# Patient Record
Sex: Male | Born: 1935 | Race: White | Hispanic: No | Marital: Married | Smoking: Former smoker
Health system: Southern US, Community
[De-identification: ages and names within clinical notes are randomized; demographics above are authoritative.]

## PROBLEM LIST (undated history)

## (undated) DIAGNOSIS — C61 Malignant neoplasm of prostate: Secondary | ICD-10-CM

## (undated) DIAGNOSIS — E119 Type 2 diabetes mellitus without complications: Secondary | ICD-10-CM

## (undated) DIAGNOSIS — H3530 Unspecified macular degeneration: Secondary | ICD-10-CM

---

## 2001-02-13 ENCOUNTER — Other Ambulatory Visit: Admission: RE | Admit: 2001-02-13 | Discharge: 2001-02-13 | Payer: Self-pay | Admitting: Urology

## 2001-02-13 ENCOUNTER — Encounter (INDEPENDENT_AMBULATORY_CARE_PROVIDER_SITE_OTHER): Payer: Self-pay | Admitting: Specialist

## 2001-03-07 ENCOUNTER — Ambulatory Visit: Admission: RE | Admit: 2001-03-07 | Discharge: 2001-06-05 | Payer: Self-pay | Admitting: Radiation Oncology

## 2004-07-22 ENCOUNTER — Encounter: Admission: RE | Admit: 2004-07-22 | Discharge: 2004-07-22 | Payer: Self-pay | Admitting: *Deleted

## 2007-01-11 ENCOUNTER — Encounter (HOSPITAL_COMMUNITY): Admission: RE | Admit: 2007-01-11 | Discharge: 2007-02-07 | Payer: Self-pay | Admitting: Urology

## 2007-03-05 ENCOUNTER — Encounter: Admission: RE | Admit: 2007-03-05 | Discharge: 2007-03-05 | Payer: Self-pay | Admitting: Urology

## 2007-03-07 ENCOUNTER — Ambulatory Visit (HOSPITAL_BASED_OUTPATIENT_CLINIC_OR_DEPARTMENT_OTHER): Admission: RE | Admit: 2007-03-07 | Discharge: 2007-03-07 | Payer: Self-pay | Admitting: Urology

## 2008-10-27 ENCOUNTER — Ambulatory Visit (HOSPITAL_COMMUNITY): Admission: RE | Admit: 2008-10-27 | Discharge: 2008-10-27 | Payer: Self-pay | Admitting: Urology

## 2009-11-09 ENCOUNTER — Encounter (HOSPITAL_COMMUNITY): Admission: RE | Admit: 2009-11-09 | Discharge: 2010-02-07 | Payer: Self-pay | Admitting: Urology

## 2010-06-06 ENCOUNTER — Encounter: Payer: Self-pay | Admitting: Urology

## 2010-09-28 NOTE — Op Note (Signed)
Albert Rogers, Albert Rogers                   ACCOUNT NO.:  [id number]   MEDICAL RECORD NO.:  [id number]          PATIENT TYPE:  AMB   LOCATION:  [facility]                         FACILITY:  [facility]   PHYSICIAN:  Ronald L. Earlene Plater, M.D.  DATE OF BIRTH:  05/04/1933   DATE OF PROCEDURE:  03/07/2007  DATE OF DISCHARGE:                               OPERATIVE REPORT   DIAGNOSIS:  Recurrent adenocarcinoma of prostate.   OPERATIVE PROCEDURE:  Cryosurgical ablation of prostate.   SURGEON:  Lucrezia Starch. Earlene Plater, M.D.   ANESTHESIA:  General endotracheal.   BLOOD LOSS:  10 mL.   TUBE:  Suprapubic tube which was placed in a punch fashion.   COMPLICATIONS:  None.   INDICATIONS FOR PROCEDURE:  Mr. Wedge is a very nice 77 year old white  male who is status post TURP in the distant past.  He subsequently  underwent x-ray therapy for a Gleason score 7 which was 4 + 3  adenocarcinoma.  He had a PSA nadir in 2006 of 0.98, subsequently  increased to 3.04 and Dr. Laverle Patter performed a biopsy in September 2008,  which revealed a Gleason score 6 which was 3 + 3 adenocarcinoma, 5% on  the right.  Metastatic workup was negative.  After understanding risks,  benefits and alternatives particularly having had TURP and radiation  with incontinence, urethral slough and other potential problems  including rectal injuries, elected to proceed with cryosurgical ablation  of prostate.   DESCRIPTION OF PROCEDURE:  The patient was placed in the supine  position.  After proper general endotracheal anesthesia, was placed in  the dorsal lithotomy position, prepped and draped with Betadine in  sterile fashion.  Cystourethroscopy was performed with a flexible  cystourethroscope and the bladder was noted to be smooth-walled.  Efflux  of clear urine was noted from the normally placed ureteral orifices  bilaterally.  There was significant apical regrowth, but there was a TUR  defect especially on the left side towards the bladder neck.  A punch  suprapubic tube was placed under direct vision and secured in place with  the Cope mechanism and sutured with silk to secure it on the skin.  The  cystoscope was removed.  A 16-French Foley catheter was inserted along  with the B&K biplanar transrectal ultrasound probe.  The prostate tissue  was localized and probes were placed in the appropriate fashion  utilizing ice rods.  Thermistors were placed in the external sphincter  and Denonvilliers' fascia.  Foley catheter was removed.  Flexible  cystourethroscopy was performed.  There were no perforations in the  urethra and the urethral warming device was placed utilizing an Amplatz  super stiff wire under direct vision and then utilizing the ultrasound  probe.  We were comfortable with locations of the probes and freeze-thaw  cycle was performed in the usual manner.  This is documented in the  record. We were comfortable with the thaw, but in the 6 o'clock  position.  There was a tiny area of tissue at the apical area that did  not completely blacken out.  We were  very comfortable with temperatures  on the thermistor probes.  After thaw, an ice seed was placed in this as  is documented and the second freeze-thaw cycle was performed and we felt  there was an excellent freeze-thaw with good thermistor temperatures and  totally blackened out prostate.  An active thaw was then performed.  The  urethral  warming was maintained for 30 minutes.  All probes were removed.  Pressure was held on the puncture site for a few minutes and dressing  was placed.  The urethral warmer was removed.  The suprapubic tube was  placed to drainage and the patient was taken to the recovery room  stable.      Ronald L. Earlene Plater, M.D.  Electronically Signed     RLD/MEDQ  D:  03/07/2007  T:  03/08/2007  Job:  [id number]

## 2010-12-20 ENCOUNTER — Other Ambulatory Visit (HOSPITAL_COMMUNITY): Payer: Self-pay | Admitting: Urology

## 2010-12-20 DIAGNOSIS — C61 Malignant neoplasm of prostate: Secondary | ICD-10-CM

## 2010-12-29 ENCOUNTER — Encounter (HOSPITAL_COMMUNITY)
Admission: RE | Admit: 2010-12-29 | Discharge: 2010-12-29 | Disposition: A | Discharge status: Home / Self Care | Payer: Medicare Other | Source: Ambulatory Visit | Attending: Urology | Admitting: Urology

## 2010-12-29 ENCOUNTER — Ambulatory Visit (HOSPITAL_COMMUNITY)
Admission: RE | Admit: 2010-12-29 | Discharge: 2010-12-29 | Disposition: A | Discharge status: Home / Self Care | Payer: Medicare Other | Source: Ambulatory Visit | Attending: Urology | Admitting: Urology

## 2010-12-29 DIAGNOSIS — C61 Malignant neoplasm of prostate: Secondary | ICD-10-CM | POA: Insufficient documentation

## 2010-12-29 MED ORDER — TECHNETIUM TC 99M MEDRONATE IV KIT
25.0000 | PACK | Freq: Once | INTRAVENOUS | Status: AC | PRN
  Administered 2010-12-29: 25 via INTRAVENOUS

## 2011-02-23 LAB — URINALYSIS, ROUTINE W REFLEX MICROSCOPIC
Glucose, UA: NEGATIVE
Hgb urine dipstick: NEGATIVE
Nitrite: NEGATIVE
Protein, ur: NEGATIVE
Specific Gravity, Urine: 1.035 — ABNORMAL HIGH
Urobilinogen, UA: 0.2
pH: 5

## 2011-02-23 LAB — PROTIME-INR
INR: 1
Prothrombin Time: 13.5

## 2011-02-23 LAB — APTT: aPTT: 29

## 2011-02-23 LAB — COMPREHENSIVE METABOLIC PANEL
ALT: 34
AST: 31
Albumin: 3.2 — ABNORMAL LOW
Alkaline Phosphatase: 96
BUN: 13
CO2: 29
Calcium: 8.9
Chloride: 104
Creatinine, Ser: 0.94
GFR calc Af Amer: >60
GFR calc non Af Amer: >60
Glucose, Bld: 145 — ABNORMAL HIGH
Potassium: 4
Sodium: 139
Total Bilirubin: 0.7
Total Protein: 5.4 — ABNORMAL LOW

## 2011-02-23 LAB — CBC
HCT: 43.1
Hemoglobin: 14.8
MCHC: 34.4
MCV: 84.4
Platelets: 129 — ABNORMAL LOW
RBC: 5.11
RDW: 15.8 — ABNORMAL HIGH
WBC: 3.9 — ABNORMAL LOW

## 2015-05-04 ENCOUNTER — Other Ambulatory Visit: Payer: Self-pay | Admitting: Urology

## 2015-05-04 DIAGNOSIS — N6310 Unspecified lump in the right breast, unspecified quadrant: Secondary | ICD-10-CM

## 2015-05-04 DIAGNOSIS — N644 Mastodynia: Secondary | ICD-10-CM

## 2015-05-08 ENCOUNTER — Ambulatory Visit
Admission: RE | Admit: 2015-05-08 | Discharge: 2015-05-08 | Disposition: A | Discharge status: Home / Self Care | Payer: Medicare Other | Source: Ambulatory Visit | Attending: Urology | Admitting: Urology

## 2015-05-08 ENCOUNTER — Ambulatory Visit
Admission: RE | Admit: 2015-05-08 | Discharge: 2015-05-08 | Disposition: A | Discharge status: Home / Self Care | Payer: Medicare Other | Source: Ambulatory Visit

## 2015-05-08 DIAGNOSIS — N644 Mastodynia: Secondary | ICD-10-CM

## 2015-05-08 DIAGNOSIS — N6310 Unspecified lump in the right breast, unspecified quadrant: Secondary | ICD-10-CM

## 2015-07-09 DIAGNOSIS — H43813 Vitreous degeneration, bilateral: Secondary | ICD-10-CM | POA: Diagnosis not present

## 2015-07-09 DIAGNOSIS — H353222 Exudative age-related macular degeneration, left eye, with inactive choroidal neovascularization: Secondary | ICD-10-CM | POA: Diagnosis not present

## 2015-07-09 DIAGNOSIS — H353112 Nonexudative age-related macular degeneration, right eye, intermediate dry stage: Secondary | ICD-10-CM | POA: Diagnosis not present

## 2015-08-26 DIAGNOSIS — C61 Malignant neoplasm of prostate: Secondary | ICD-10-CM | POA: Diagnosis not present

## 2015-09-02 DIAGNOSIS — Z0000 Encounter for general adult medical examination without abnormal findings: Secondary | ICD-10-CM | POA: Diagnosis not present

## 2015-09-02 DIAGNOSIS — M8580 Other specified disorders of bone density and structure, unspecified site: Secondary | ICD-10-CM | POA: Diagnosis not present

## 2015-09-02 DIAGNOSIS — C61 Malignant neoplasm of prostate: Secondary | ICD-10-CM | POA: Diagnosis not present

## 2015-09-10 DIAGNOSIS — H353222 Exudative age-related macular degeneration, left eye, with inactive choroidal neovascularization: Secondary | ICD-10-CM | POA: Diagnosis not present

## 2015-10-22 DIAGNOSIS — H353222 Exudative age-related macular degeneration, left eye, with inactive choroidal neovascularization: Secondary | ICD-10-CM | POA: Diagnosis not present

## 2015-12-24 DIAGNOSIS — H353221 Exudative age-related macular degeneration, left eye, with active choroidal neovascularization: Secondary | ICD-10-CM | POA: Diagnosis not present

## 2015-12-24 DIAGNOSIS — H353111 Nonexudative age-related macular degeneration, right eye, early dry stage: Secondary | ICD-10-CM | POA: Diagnosis not present

## 2016-01-06 DIAGNOSIS — C61 Malignant neoplasm of prostate: Secondary | ICD-10-CM | POA: Diagnosis not present

## 2016-01-08 DIAGNOSIS — L308 Other specified dermatitis: Secondary | ICD-10-CM | POA: Diagnosis not present

## 2016-01-08 DIAGNOSIS — L821 Other seborrheic keratosis: Secondary | ICD-10-CM | POA: Diagnosis not present

## 2016-01-08 DIAGNOSIS — D485 Neoplasm of uncertain behavior of skin: Secondary | ICD-10-CM | POA: Diagnosis not present

## 2016-01-08 DIAGNOSIS — L570 Actinic keratosis: Secondary | ICD-10-CM | POA: Diagnosis not present

## 2016-01-08 DIAGNOSIS — D230 Other benign neoplasm of skin of lip: Secondary | ICD-10-CM | POA: Diagnosis not present

## 2016-01-13 DIAGNOSIS — C61 Malignant neoplasm of prostate: Secondary | ICD-10-CM | POA: Diagnosis not present

## 2016-01-28 DIAGNOSIS — L309 Dermatitis, unspecified: Secondary | ICD-10-CM | POA: Diagnosis not present

## 2016-03-02 DIAGNOSIS — H353221 Exudative age-related macular degeneration, left eye, with active choroidal neovascularization: Secondary | ICD-10-CM | POA: Diagnosis not present

## 2016-05-11 DIAGNOSIS — H353221 Exudative age-related macular degeneration, left eye, with active choroidal neovascularization: Secondary | ICD-10-CM | POA: Diagnosis not present

## 2016-05-20 DIAGNOSIS — C61 Malignant neoplasm of prostate: Secondary | ICD-10-CM | POA: Diagnosis not present

## 2016-05-27 DIAGNOSIS — C61 Malignant neoplasm of prostate: Secondary | ICD-10-CM | POA: Diagnosis not present

## 2016-07-13 DIAGNOSIS — H353221 Exudative age-related macular degeneration, left eye, with active choroidal neovascularization: Secondary | ICD-10-CM | POA: Diagnosis not present

## 2016-09-19 ENCOUNTER — Other Ambulatory Visit: Payer: Self-pay | Admitting: Urology

## 2016-09-19 DIAGNOSIS — M8580 Other specified disorders of bone density and structure, unspecified site: Secondary | ICD-10-CM

## 2016-09-21 DIAGNOSIS — H353221 Exudative age-related macular degeneration, left eye, with active choroidal neovascularization: Secondary | ICD-10-CM | POA: Diagnosis not present

## 2016-09-30 ENCOUNTER — Ambulatory Visit
Admission: RE | Admit: 2016-09-30 | Discharge: 2016-09-30 | Disposition: A | Discharge status: Home / Self Care | Payer: Medicare Other | Source: Ambulatory Visit | Attending: Urology | Admitting: Urology

## 2016-09-30 DIAGNOSIS — C61 Malignant neoplasm of prostate: Secondary | ICD-10-CM | POA: Diagnosis not present

## 2016-09-30 DIAGNOSIS — M85851 Other specified disorders of bone density and structure, right thigh: Secondary | ICD-10-CM | POA: Diagnosis not present

## 2016-09-30 DIAGNOSIS — M8580 Other specified disorders of bone density and structure, unspecified site: Secondary | ICD-10-CM

## 2016-10-07 DIAGNOSIS — C61 Malignant neoplasm of prostate: Secondary | ICD-10-CM | POA: Diagnosis not present

## 2016-11-30 DIAGNOSIS — H353221 Exudative age-related macular degeneration, left eye, with active choroidal neovascularization: Secondary | ICD-10-CM | POA: Diagnosis not present

## 2016-12-23 ENCOUNTER — Other Ambulatory Visit: Payer: Self-pay | Admitting: Physician Assistant

## 2016-12-23 ENCOUNTER — Ambulatory Visit: Payer: Medicare Other

## 2016-12-23 ENCOUNTER — Ambulatory Visit
Admission: RE | Admit: 2016-12-23 | Discharge: 2016-12-23 | Disposition: A | Discharge status: Home / Self Care | Payer: Medicare Other | Source: Ambulatory Visit | Attending: Physician Assistant | Admitting: Physician Assistant

## 2016-12-23 DIAGNOSIS — M545 Low back pain: Secondary | ICD-10-CM | POA: Diagnosis not present

## 2016-12-23 DIAGNOSIS — R0789 Other chest pain: Secondary | ICD-10-CM | POA: Diagnosis not present

## 2016-12-23 DIAGNOSIS — S2243XA Multiple fractures of ribs, bilateral, initial encounter for closed fracture: Secondary | ICD-10-CM | POA: Diagnosis not present

## 2016-12-23 DIAGNOSIS — R7309 Other abnormal glucose: Secondary | ICD-10-CM | POA: Diagnosis not present

## 2016-12-23 DIAGNOSIS — R55 Syncope and collapse: Secondary | ICD-10-CM | POA: Diagnosis not present

## 2016-12-23 DIAGNOSIS — W19XXXA Unspecified fall, initial encounter: Secondary | ICD-10-CM

## 2016-12-23 DIAGNOSIS — S3992XA Unspecified injury of lower back, initial encounter: Secondary | ICD-10-CM | POA: Diagnosis not present

## 2016-12-23 DIAGNOSIS — S2239XA Fracture of one rib, unspecified side, initial encounter for closed fracture: Secondary | ICD-10-CM | POA: Diagnosis not present

## 2016-12-28 DIAGNOSIS — E119 Type 2 diabetes mellitus without complications: Secondary | ICD-10-CM | POA: Diagnosis not present

## 2016-12-28 DIAGNOSIS — S2239XD Fracture of one rib, unspecified side, subsequent encounter for fracture with routine healing: Secondary | ICD-10-CM | POA: Diagnosis not present

## 2016-12-28 DIAGNOSIS — Z7984 Long term (current) use of oral hypoglycemic drugs: Secondary | ICD-10-CM | POA: Diagnosis not present

## 2017-01-25 DIAGNOSIS — Z7984 Long term (current) use of oral hypoglycemic drugs: Secondary | ICD-10-CM | POA: Diagnosis not present

## 2017-01-25 DIAGNOSIS — S2239XA Fracture of one rib, unspecified side, initial encounter for closed fracture: Secondary | ICD-10-CM | POA: Diagnosis not present

## 2017-01-25 DIAGNOSIS — E1165 Type 2 diabetes mellitus with hyperglycemia: Secondary | ICD-10-CM | POA: Diagnosis not present

## 2017-02-01 DIAGNOSIS — C61 Malignant neoplasm of prostate: Secondary | ICD-10-CM | POA: Diagnosis not present

## 2017-02-08 DIAGNOSIS — C61 Malignant neoplasm of prostate: Secondary | ICD-10-CM | POA: Diagnosis not present

## 2017-02-16 DIAGNOSIS — Z23 Encounter for immunization: Secondary | ICD-10-CM | POA: Diagnosis not present

## 2017-02-21 DIAGNOSIS — H35372 Puckering of macula, left eye: Secondary | ICD-10-CM | POA: Diagnosis not present

## 2017-02-21 DIAGNOSIS — H353112 Nonexudative age-related macular degeneration, right eye, intermediate dry stage: Secondary | ICD-10-CM | POA: Diagnosis not present

## 2017-02-21 DIAGNOSIS — H353221 Exudative age-related macular degeneration, left eye, with active choroidal neovascularization: Secondary | ICD-10-CM | POA: Diagnosis not present

## 2017-02-21 DIAGNOSIS — E119 Type 2 diabetes mellitus without complications: Secondary | ICD-10-CM | POA: Diagnosis not present

## 2017-04-27 DIAGNOSIS — H353221 Exudative age-related macular degeneration, left eye, with active choroidal neovascularization: Secondary | ICD-10-CM | POA: Diagnosis not present

## 2017-04-28 DIAGNOSIS — E119 Type 2 diabetes mellitus without complications: Secondary | ICD-10-CM | POA: Diagnosis not present

## 2017-04-28 DIAGNOSIS — Z23 Encounter for immunization: Secondary | ICD-10-CM | POA: Diagnosis not present

## 2017-06-05 DIAGNOSIS — C61 Malignant neoplasm of prostate: Secondary | ICD-10-CM | POA: Diagnosis not present

## 2017-06-14 DIAGNOSIS — C61 Malignant neoplasm of prostate: Secondary | ICD-10-CM | POA: Diagnosis not present

## 2017-06-29 DIAGNOSIS — H353221 Exudative age-related macular degeneration, left eye, with active choroidal neovascularization: Secondary | ICD-10-CM | POA: Diagnosis not present

## 2017-07-05 DIAGNOSIS — H353212 Exudative age-related macular degeneration, right eye, with inactive choroidal neovascularization: Secondary | ICD-10-CM | POA: Diagnosis not present

## 2017-07-05 DIAGNOSIS — H353221 Exudative age-related macular degeneration, left eye, with active choroidal neovascularization: Secondary | ICD-10-CM | POA: Diagnosis not present

## 2017-07-05 DIAGNOSIS — H43813 Vitreous degeneration, bilateral: Secondary | ICD-10-CM | POA: Diagnosis not present

## 2017-09-06 DIAGNOSIS — H353112 Nonexudative age-related macular degeneration, right eye, intermediate dry stage: Secondary | ICD-10-CM | POA: Diagnosis not present

## 2017-09-06 DIAGNOSIS — H353221 Exudative age-related macular degeneration, left eye, with active choroidal neovascularization: Secondary | ICD-10-CM | POA: Diagnosis not present

## 2017-10-06 DIAGNOSIS — C61 Malignant neoplasm of prostate: Secondary | ICD-10-CM | POA: Diagnosis not present

## 2017-10-13 DIAGNOSIS — C61 Malignant neoplasm of prostate: Secondary | ICD-10-CM | POA: Diagnosis not present

## 2017-10-27 DIAGNOSIS — E119 Type 2 diabetes mellitus without complications: Secondary | ICD-10-CM | POA: Diagnosis not present

## 2017-10-27 DIAGNOSIS — M792 Neuralgia and neuritis, unspecified: Secondary | ICD-10-CM | POA: Diagnosis not present

## 2017-11-08 DIAGNOSIS — H353112 Nonexudative age-related macular degeneration, right eye, intermediate dry stage: Secondary | ICD-10-CM | POA: Diagnosis not present

## 2017-11-08 DIAGNOSIS — E119 Type 2 diabetes mellitus without complications: Secondary | ICD-10-CM | POA: Diagnosis not present

## 2017-11-08 DIAGNOSIS — H43813 Vitreous degeneration, bilateral: Secondary | ICD-10-CM | POA: Diagnosis not present

## 2017-11-08 DIAGNOSIS — H353221 Exudative age-related macular degeneration, left eye, with active choroidal neovascularization: Secondary | ICD-10-CM | POA: Diagnosis not present

## 2017-11-22 DIAGNOSIS — H353221 Exudative age-related macular degeneration, left eye, with active choroidal neovascularization: Secondary | ICD-10-CM | POA: Diagnosis not present

## 2018-02-07 DIAGNOSIS — H353221 Exudative age-related macular degeneration, left eye, with active choroidal neovascularization: Secondary | ICD-10-CM | POA: Diagnosis not present

## 2018-02-14 DIAGNOSIS — C61 Malignant neoplasm of prostate: Secondary | ICD-10-CM | POA: Diagnosis not present

## 2018-02-21 DIAGNOSIS — C61 Malignant neoplasm of prostate: Secondary | ICD-10-CM | POA: Diagnosis not present

## 2018-02-23 DIAGNOSIS — Z23 Encounter for immunization: Secondary | ICD-10-CM | POA: Diagnosis not present

## 2018-04-25 DIAGNOSIS — H353211 Exudative age-related macular degeneration, right eye, with active choroidal neovascularization: Secondary | ICD-10-CM | POA: Diagnosis not present

## 2018-04-25 DIAGNOSIS — H353221 Exudative age-related macular degeneration, left eye, with active choroidal neovascularization: Secondary | ICD-10-CM | POA: Diagnosis not present

## 2018-05-04 DIAGNOSIS — E119 Type 2 diabetes mellitus without complications: Secondary | ICD-10-CM | POA: Diagnosis not present

## 2018-05-04 DIAGNOSIS — M792 Neuralgia and neuritis, unspecified: Secondary | ICD-10-CM | POA: Diagnosis not present

## 2018-05-04 DIAGNOSIS — D485 Neoplasm of uncertain behavior of skin: Secondary | ICD-10-CM | POA: Diagnosis not present

## 2018-05-04 DIAGNOSIS — D489 Neoplasm of uncertain behavior, unspecified: Secondary | ICD-10-CM | POA: Diagnosis not present

## 2018-05-04 DIAGNOSIS — D4989 Neoplasm of unspecified behavior of other specified sites: Secondary | ICD-10-CM | POA: Diagnosis not present

## 2018-06-05 DIAGNOSIS — H353133 Nonexudative age-related macular degeneration, bilateral, advanced atrophic without subfoveal involvement: Secondary | ICD-10-CM | POA: Diagnosis not present

## 2018-06-05 DIAGNOSIS — H5201 Hypermetropia, right eye: Secondary | ICD-10-CM | POA: Diagnosis not present

## 2018-06-06 DIAGNOSIS — H353211 Exudative age-related macular degeneration, right eye, with active choroidal neovascularization: Secondary | ICD-10-CM | POA: Diagnosis not present

## 2018-06-06 DIAGNOSIS — Z961 Presence of intraocular lens: Secondary | ICD-10-CM | POA: Diagnosis not present

## 2018-06-06 DIAGNOSIS — H35363 Drusen (degenerative) of macula, bilateral: Secondary | ICD-10-CM | POA: Diagnosis not present

## 2018-06-06 DIAGNOSIS — H35453 Secondary pigmentary degeneration, bilateral: Secondary | ICD-10-CM | POA: Diagnosis not present

## 2018-06-06 DIAGNOSIS — H353221 Exudative age-related macular degeneration, left eye, with active choroidal neovascularization: Secondary | ICD-10-CM | POA: Diagnosis not present

## 2018-06-06 DIAGNOSIS — E119 Type 2 diabetes mellitus without complications: Secondary | ICD-10-CM | POA: Diagnosis not present

## 2018-07-04 DIAGNOSIS — H353221 Exudative age-related macular degeneration, left eye, with active choroidal neovascularization: Secondary | ICD-10-CM | POA: Diagnosis not present

## 2018-07-06 DIAGNOSIS — C61 Malignant neoplasm of prostate: Secondary | ICD-10-CM | POA: Diagnosis not present

## 2018-07-13 DIAGNOSIS — Z5111 Encounter for antineoplastic chemotherapy: Secondary | ICD-10-CM | POA: Diagnosis not present

## 2018-07-13 DIAGNOSIS — C61 Malignant neoplasm of prostate: Secondary | ICD-10-CM | POA: Diagnosis not present

## 2018-07-16 IMAGING — CR DG RIBS BILAT 3V
5 series · 5 of 5 positions shown · non-contrast
Comparison: None.

CLINICAL DATA: Bilateral rib pain after fall 2 days ago.

EXAM:
BILATERAL RIBS - 3+ VIEW

[w ribs ap/pa lower right * (1 of 2)]
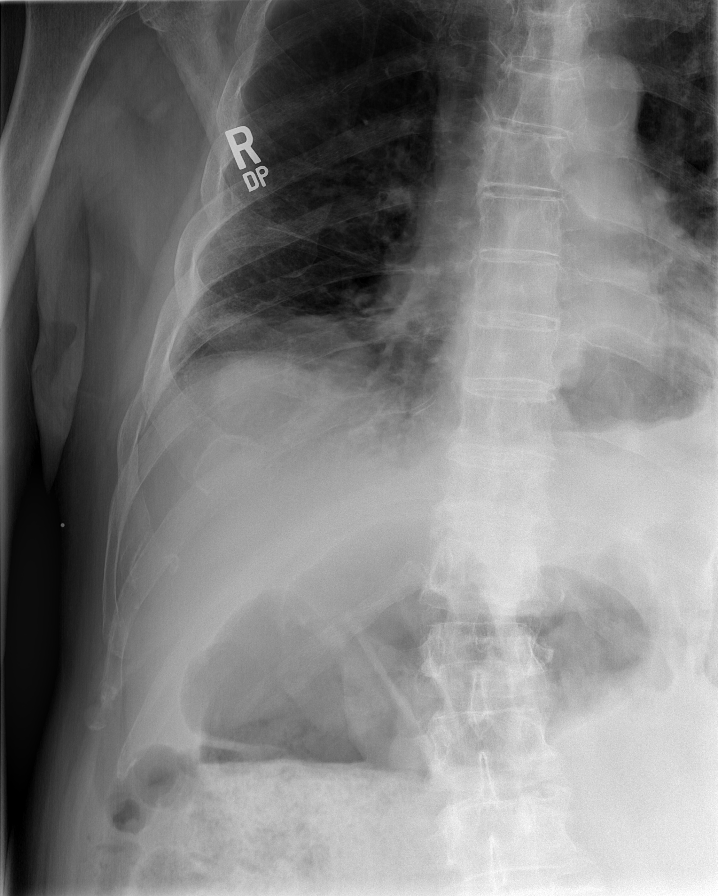

[w ribs ap/pa lower right * (2 of 2)]
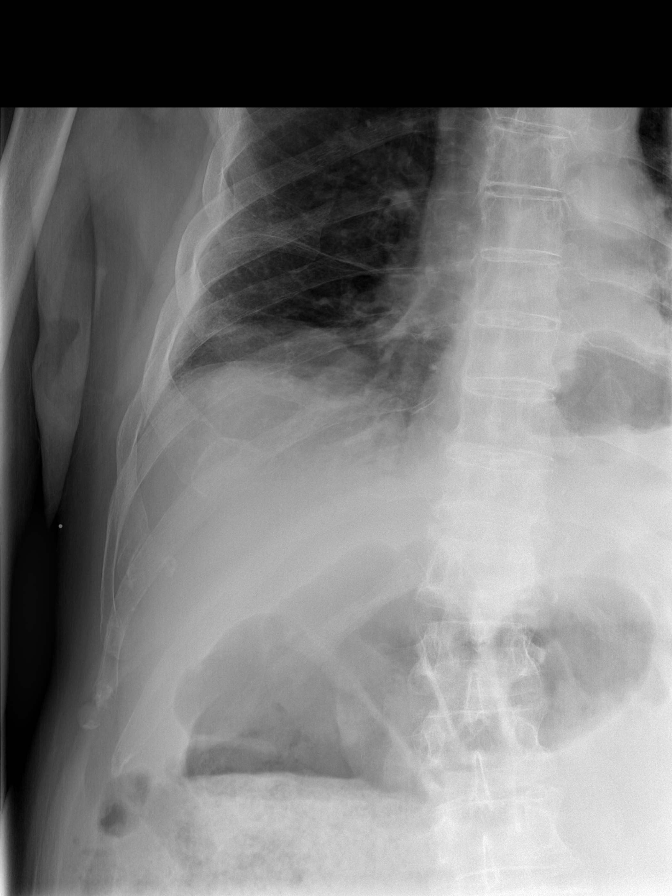

[w ribs oblique right *]
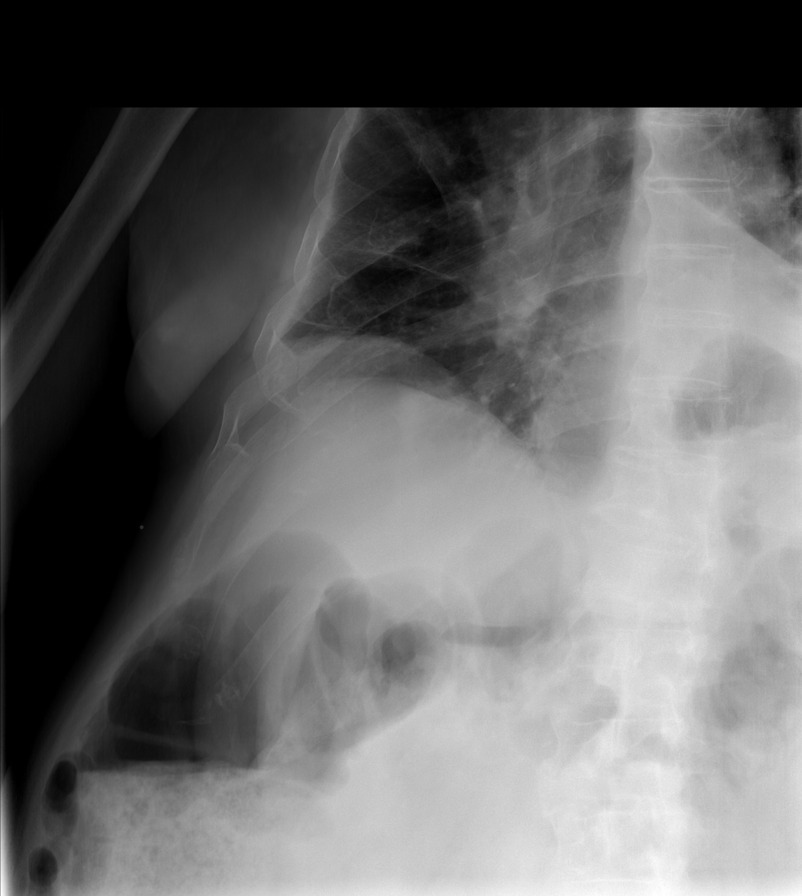

[w ribs ap/pa lower left *]
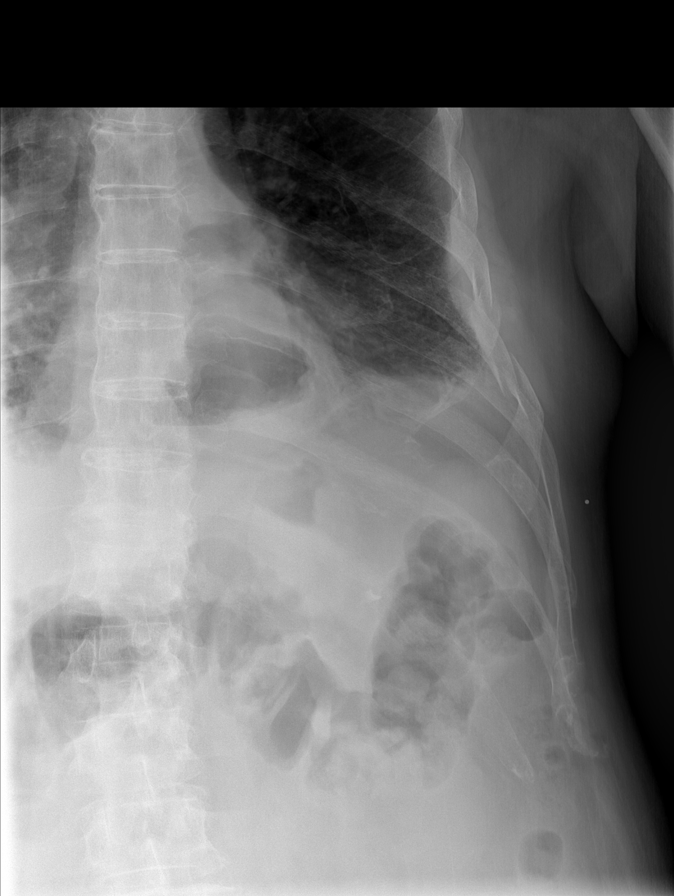

[w ribs oblique left *]
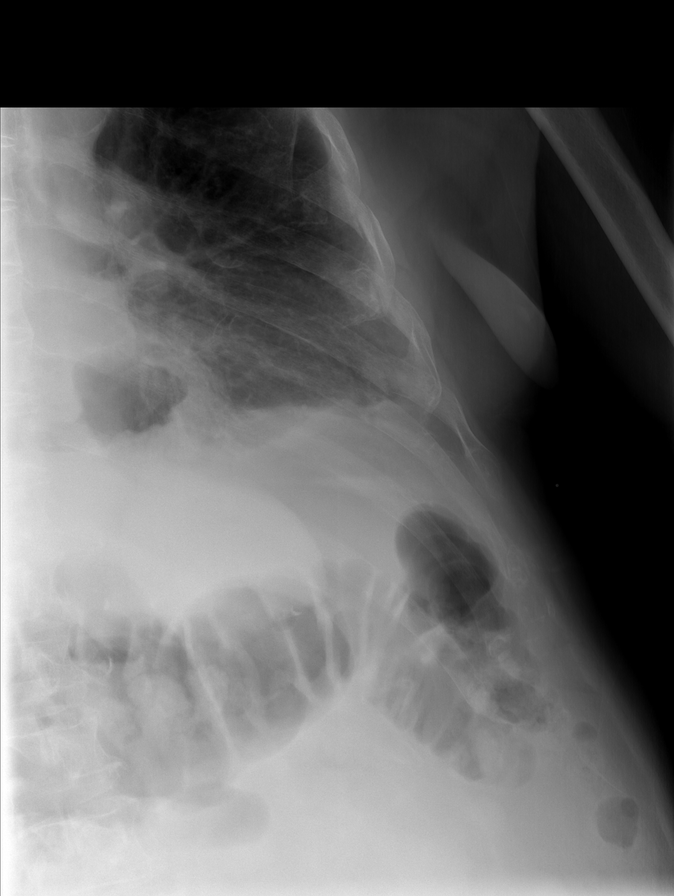

[5 of 5 positions shown; findings below may reference images not displayed]

FINDINGS: Mildly displaced fracture seen involving the lateral portion of the
right eighth rib. Mildly displaced fractures are seen involving the
lateral portions of the left sixth, seventh and eighth ribs.
IMPRESSION: Mildly displaced bilateral rib fractures as described above.

## 2018-07-16 IMAGING — CR DG LUMBAR SPINE COMPLETE 4+V
5 series · 5 of 5 positions shown · non-contrast
Comparison: CT scan of December 29, 2010.

CLINICAL DATA: Lower back pain after fall 2 days ago.

EXAM:
LUMBAR SPINE - COMPLETE 4+ VIEW

[w l-spine a.p. * (1 of 3)]
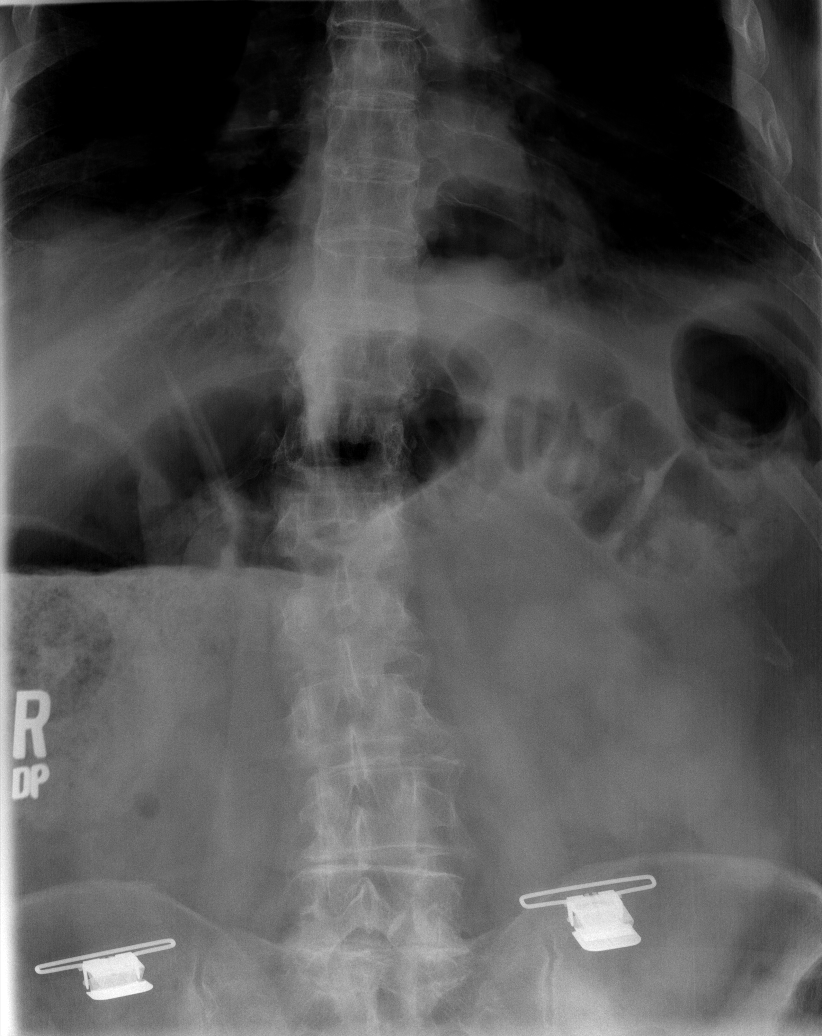

[w l-spine a.p. * (2 of 3)]
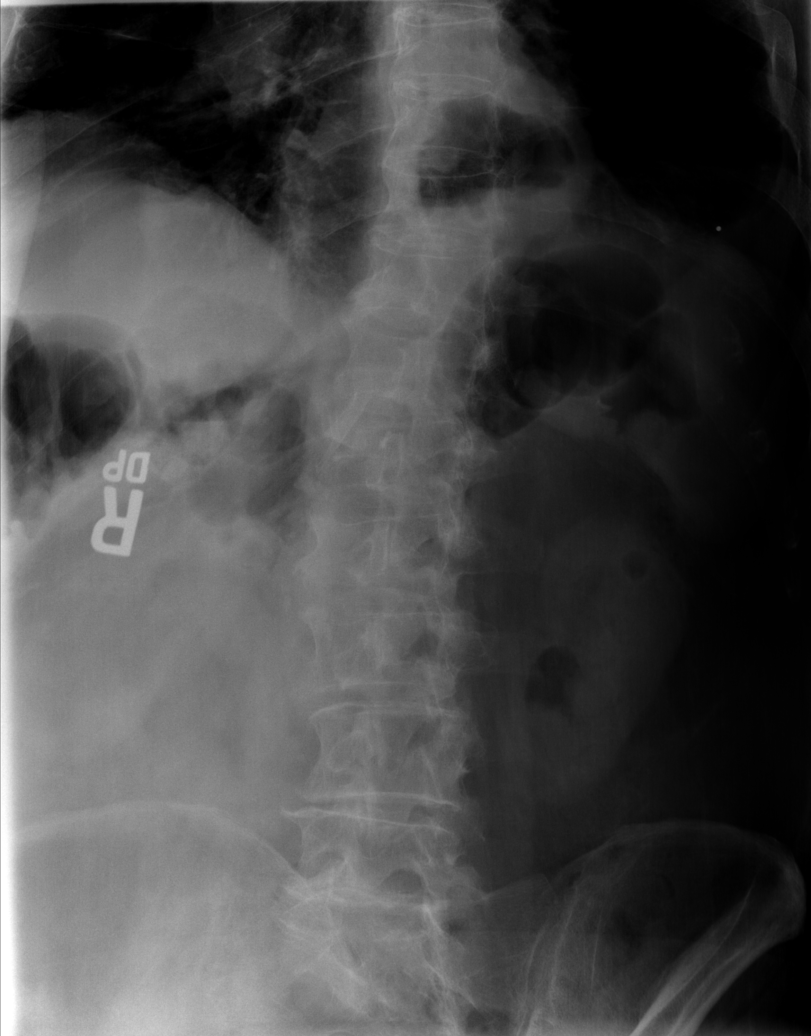

[w l-spine a.p. * (3 of 3)]
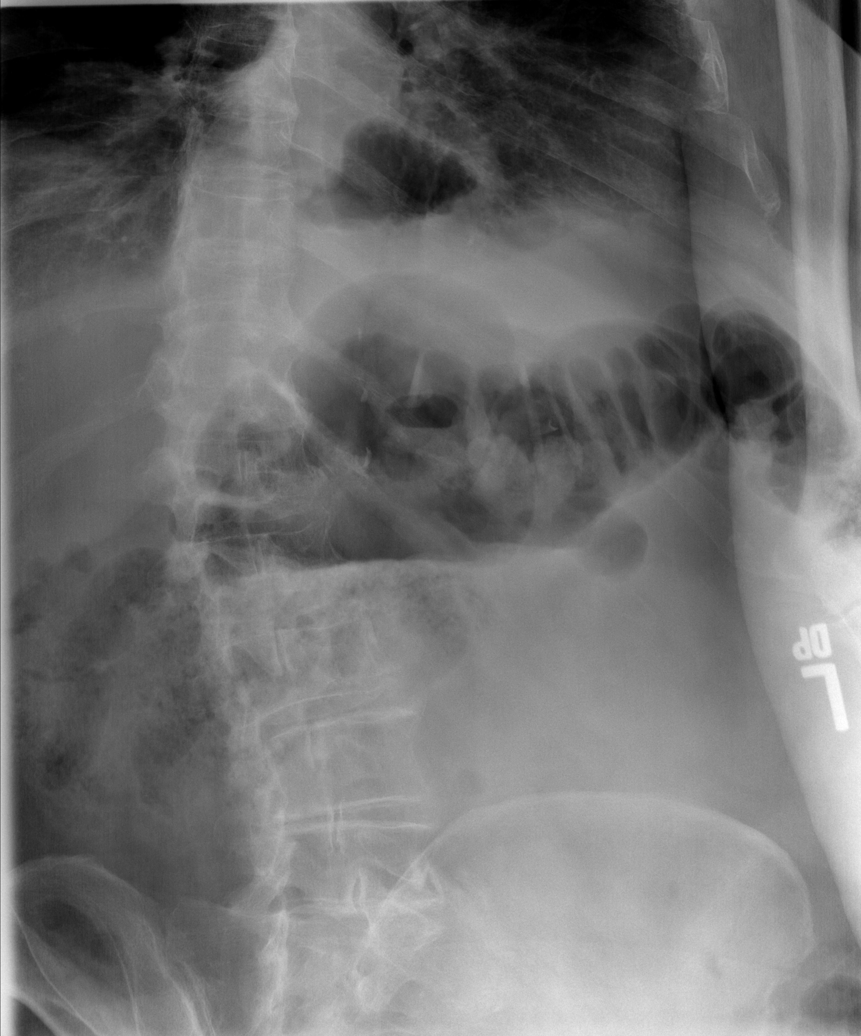

[w l-spine lat *]
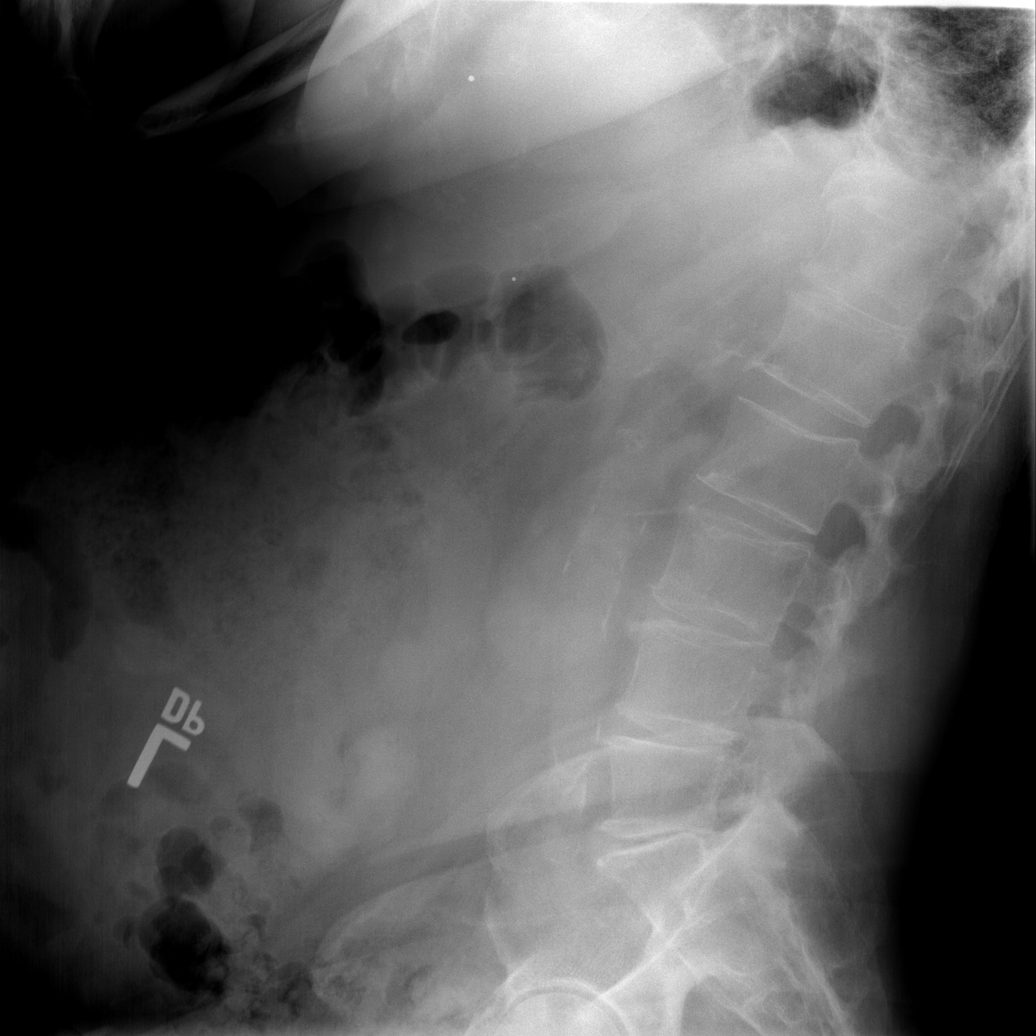

[w l-spine flexion/extension *]
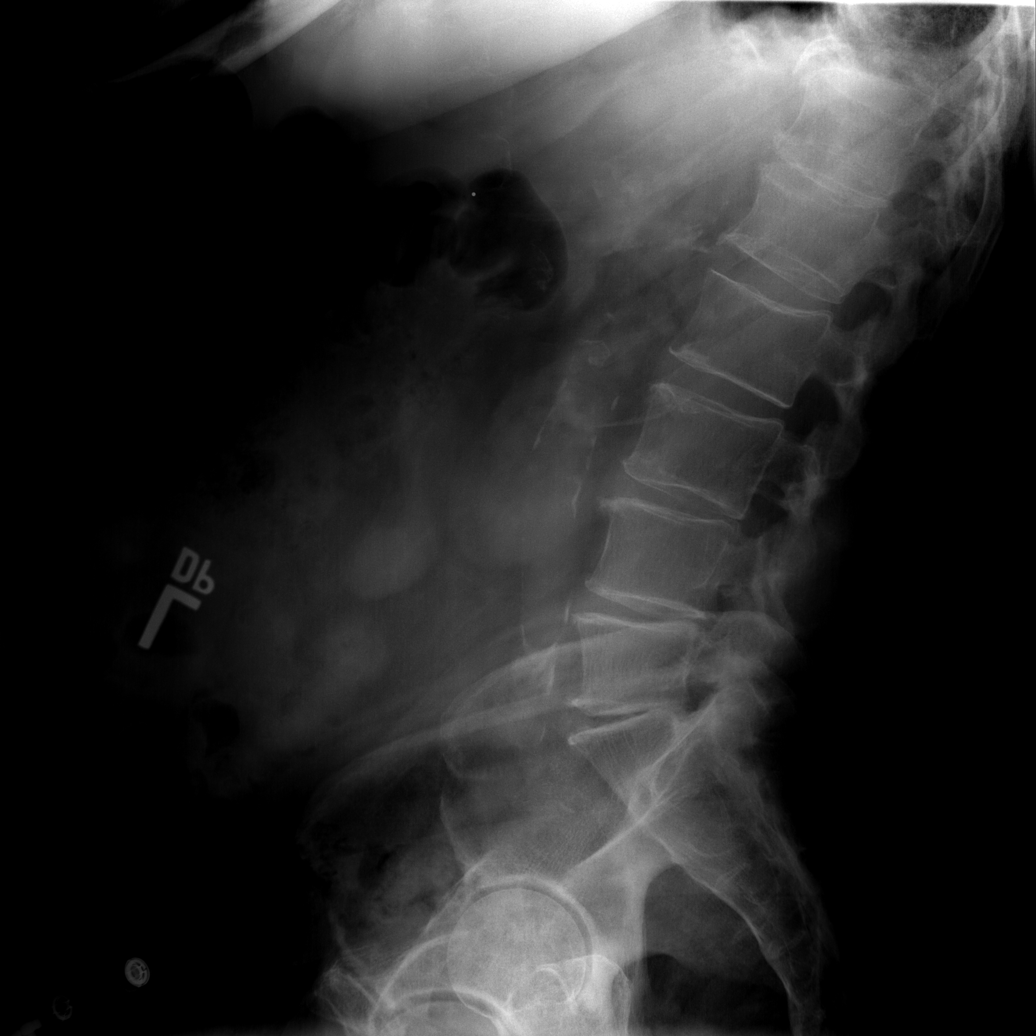

[5 of 5 positions shown; findings below may reference images not displayed]

FINDINGS: No fracture or spondylolisthesis is noted. Moderate degenerative
disc disease is noted at L3-4 and L4-5. Severe degenerative disc
disease is noted at L5-S1. Atherosclerosis of thoracic aorta is
noted. Posterior facet joints are unremarkable.
IMPRESSION: Multilevel degenerative disc disease. No acute abnormality seen in
the lumbar spine. Aortic atherosclerosis.

## 2018-07-17 ENCOUNTER — Other Ambulatory Visit: Payer: Self-pay | Admitting: Urology

## 2018-07-17 DIAGNOSIS — M8580 Other specified disorders of bone density and structure, unspecified site: Secondary | ICD-10-CM

## 2018-07-18 DIAGNOSIS — H353221 Exudative age-related macular degeneration, left eye, with active choroidal neovascularization: Secondary | ICD-10-CM | POA: Diagnosis not present

## 2018-08-29 DIAGNOSIS — H353221 Exudative age-related macular degeneration, left eye, with active choroidal neovascularization: Secondary | ICD-10-CM | POA: Diagnosis not present

## 2018-09-04 DIAGNOSIS — H353211 Exudative age-related macular degeneration, right eye, with active choroidal neovascularization: Secondary | ICD-10-CM | POA: Diagnosis not present

## 2018-10-24 DIAGNOSIS — H353221 Exudative age-related macular degeneration, left eye, with active choroidal neovascularization: Secondary | ICD-10-CM | POA: Diagnosis not present

## 2018-11-05 DIAGNOSIS — E7800 Pure hypercholesterolemia, unspecified: Secondary | ICD-10-CM | POA: Diagnosis not present

## 2018-11-05 DIAGNOSIS — E119 Type 2 diabetes mellitus without complications: Secondary | ICD-10-CM | POA: Diagnosis not present

## 2018-11-05 DIAGNOSIS — M792 Neuralgia and neuritis, unspecified: Secondary | ICD-10-CM | POA: Diagnosis not present

## 2018-11-05 DIAGNOSIS — Z7984 Long term (current) use of oral hypoglycemic drugs: Secondary | ICD-10-CM | POA: Diagnosis not present

## 2018-11-06 DIAGNOSIS — H353211 Exudative age-related macular degeneration, right eye, with active choroidal neovascularization: Secondary | ICD-10-CM | POA: Diagnosis not present

## 2018-11-08 DIAGNOSIS — C61 Malignant neoplasm of prostate: Secondary | ICD-10-CM | POA: Diagnosis not present

## 2018-11-12 ENCOUNTER — Ambulatory Visit
Admission: RE | Admit: 2018-11-12 | Discharge: 2018-11-12 | Disposition: A | Discharge status: Home / Self Care | Payer: Medicare Other | Source: Ambulatory Visit | Attending: Urology | Admitting: Urology

## 2018-11-12 ENCOUNTER — Other Ambulatory Visit: Payer: Self-pay

## 2018-11-12 DIAGNOSIS — E119 Type 2 diabetes mellitus without complications: Secondary | ICD-10-CM | POA: Diagnosis not present

## 2018-11-12 DIAGNOSIS — E7800 Pure hypercholesterolemia, unspecified: Secondary | ICD-10-CM | POA: Diagnosis not present

## 2018-11-12 DIAGNOSIS — Z7984 Long term (current) use of oral hypoglycemic drugs: Secondary | ICD-10-CM | POA: Diagnosis not present

## 2018-11-12 DIAGNOSIS — M85851 Other specified disorders of bone density and structure, right thigh: Secondary | ICD-10-CM | POA: Diagnosis not present

## 2018-11-12 DIAGNOSIS — M8580 Other specified disorders of bone density and structure, unspecified site: Secondary | ICD-10-CM

## 2018-11-14 DIAGNOSIS — M8580 Other specified disorders of bone density and structure, unspecified site: Secondary | ICD-10-CM | POA: Diagnosis not present

## 2018-11-14 DIAGNOSIS — C61 Malignant neoplasm of prostate: Secondary | ICD-10-CM | POA: Diagnosis not present

## 2018-12-19 DIAGNOSIS — H353221 Exudative age-related macular degeneration, left eye, with active choroidal neovascularization: Secondary | ICD-10-CM | POA: Diagnosis not present

## 2019-01-15 DIAGNOSIS — H353211 Exudative age-related macular degeneration, right eye, with active choroidal neovascularization: Secondary | ICD-10-CM | POA: Diagnosis not present

## 2019-02-20 DIAGNOSIS — H353221 Exudative age-related macular degeneration, left eye, with active choroidal neovascularization: Secondary | ICD-10-CM | POA: Diagnosis not present

## 2019-03-05 DIAGNOSIS — H353211 Exudative age-related macular degeneration, right eye, with active choroidal neovascularization: Secondary | ICD-10-CM | POA: Diagnosis not present

## 2019-03-14 DIAGNOSIS — C61 Malignant neoplasm of prostate: Secondary | ICD-10-CM | POA: Diagnosis not present

## 2019-03-20 DIAGNOSIS — C61 Malignant neoplasm of prostate: Secondary | ICD-10-CM | POA: Diagnosis not present

## 2019-04-17 DIAGNOSIS — H353221 Exudative age-related macular degeneration, left eye, with active choroidal neovascularization: Secondary | ICD-10-CM | POA: Diagnosis not present

## 2019-05-22 DIAGNOSIS — E119 Type 2 diabetes mellitus without complications: Secondary | ICD-10-CM | POA: Diagnosis not present

## 2019-06-04 DIAGNOSIS — H353211 Exudative age-related macular degeneration, right eye, with active choroidal neovascularization: Secondary | ICD-10-CM | POA: Diagnosis not present

## 2019-06-18 DIAGNOSIS — H353221 Exudative age-related macular degeneration, left eye, with active choroidal neovascularization: Secondary | ICD-10-CM | POA: Diagnosis not present

## 2019-07-12 DIAGNOSIS — C61 Malignant neoplasm of prostate: Secondary | ICD-10-CM | POA: Diagnosis not present

## 2019-07-19 DIAGNOSIS — C61 Malignant neoplasm of prostate: Secondary | ICD-10-CM | POA: Diagnosis not present

## 2019-07-23 DIAGNOSIS — H353211 Exudative age-related macular degeneration, right eye, with active choroidal neovascularization: Secondary | ICD-10-CM | POA: Diagnosis not present

## 2019-08-27 DIAGNOSIS — H353221 Exudative age-related macular degeneration, left eye, with active choroidal neovascularization: Secondary | ICD-10-CM | POA: Diagnosis not present

## 2019-09-18 DIAGNOSIS — H353211 Exudative age-related macular degeneration, right eye, with active choroidal neovascularization: Secondary | ICD-10-CM | POA: Diagnosis not present

## 2019-11-26 DIAGNOSIS — C61 Malignant neoplasm of prostate: Secondary | ICD-10-CM | POA: Diagnosis not present

## 2019-12-04 DIAGNOSIS — M8580 Other specified disorders of bone density and structure, unspecified site: Secondary | ICD-10-CM | POA: Diagnosis not present

## 2019-12-04 DIAGNOSIS — Z5111 Encounter for antineoplastic chemotherapy: Secondary | ICD-10-CM | POA: Diagnosis not present

## 2019-12-04 DIAGNOSIS — C61 Malignant neoplasm of prostate: Secondary | ICD-10-CM | POA: Diagnosis not present

## 2019-12-04 DIAGNOSIS — N401 Enlarged prostate with lower urinary tract symptoms: Secondary | ICD-10-CM | POA: Diagnosis not present

## 2019-12-04 DIAGNOSIS — N3941 Urge incontinence: Secondary | ICD-10-CM | POA: Diagnosis not present

## 2019-12-17 DIAGNOSIS — C44212 Basal cell carcinoma of skin of right ear and external auricular canal: Secondary | ICD-10-CM | POA: Diagnosis not present

## 2019-12-17 DIAGNOSIS — L570 Actinic keratosis: Secondary | ICD-10-CM | POA: Diagnosis not present

## 2019-12-17 DIAGNOSIS — Z85828 Personal history of other malignant neoplasm of skin: Secondary | ICD-10-CM | POA: Diagnosis not present

## 2019-12-17 DIAGNOSIS — C44629 Squamous cell carcinoma of skin of left upper limb, including shoulder: Secondary | ICD-10-CM | POA: Diagnosis not present

## 2019-12-17 DIAGNOSIS — C44622 Squamous cell carcinoma of skin of right upper limb, including shoulder: Secondary | ICD-10-CM | POA: Diagnosis not present

## 2019-12-17 DIAGNOSIS — L281 Prurigo nodularis: Secondary | ICD-10-CM | POA: Diagnosis not present

## 2019-12-17 DIAGNOSIS — L821 Other seborrheic keratosis: Secondary | ICD-10-CM | POA: Diagnosis not present

## 2019-12-18 DIAGNOSIS — H353211 Exudative age-related macular degeneration, right eye, with active choroidal neovascularization: Secondary | ICD-10-CM | POA: Diagnosis not present

## 2020-01-29 DIAGNOSIS — H353211 Exudative age-related macular degeneration, right eye, with active choroidal neovascularization: Secondary | ICD-10-CM | POA: Diagnosis not present

## 2020-02-21 DIAGNOSIS — Z23 Encounter for immunization: Secondary | ICD-10-CM | POA: Diagnosis not present

## 2020-02-25 DIAGNOSIS — H353221 Exudative age-related macular degeneration, left eye, with active choroidal neovascularization: Secondary | ICD-10-CM | POA: Diagnosis not present

## 2020-03-18 DIAGNOSIS — H353211 Exudative age-related macular degeneration, right eye, with active choroidal neovascularization: Secondary | ICD-10-CM | POA: Diagnosis not present

## 2020-03-31 DIAGNOSIS — C61 Malignant neoplasm of prostate: Secondary | ICD-10-CM | POA: Diagnosis not present

## 2020-04-01 DIAGNOSIS — Z23 Encounter for immunization: Secondary | ICD-10-CM | POA: Diagnosis not present

## 2020-04-07 DIAGNOSIS — Z5111 Encounter for antineoplastic chemotherapy: Secondary | ICD-10-CM | POA: Diagnosis not present

## 2020-04-07 DIAGNOSIS — C61 Malignant neoplasm of prostate: Secondary | ICD-10-CM | POA: Diagnosis not present

## 2020-04-20 DIAGNOSIS — C4442 Squamous cell carcinoma of skin of scalp and neck: Secondary | ICD-10-CM | POA: Diagnosis not present

## 2020-04-20 DIAGNOSIS — Z85828 Personal history of other malignant neoplasm of skin: Secondary | ICD-10-CM | POA: Diagnosis not present

## 2020-04-20 DIAGNOSIS — C4441 Basal cell carcinoma of skin of scalp and neck: Secondary | ICD-10-CM | POA: Diagnosis not present

## 2020-04-20 DIAGNOSIS — D045 Carcinoma in situ of skin of trunk: Secondary | ICD-10-CM | POA: Diagnosis not present

## 2020-04-20 DIAGNOSIS — C44629 Squamous cell carcinoma of skin of left upper limb, including shoulder: Secondary | ICD-10-CM | POA: Diagnosis not present

## 2020-04-20 DIAGNOSIS — L281 Prurigo nodularis: Secondary | ICD-10-CM | POA: Diagnosis not present

## 2020-04-20 DIAGNOSIS — L814 Other melanin hyperpigmentation: Secondary | ICD-10-CM | POA: Diagnosis not present

## 2020-04-20 DIAGNOSIS — L570 Actinic keratosis: Secondary | ICD-10-CM | POA: Diagnosis not present

## 2020-04-20 DIAGNOSIS — C44219 Basal cell carcinoma of skin of left ear and external auricular canal: Secondary | ICD-10-CM | POA: Diagnosis not present

## 2020-05-11 DIAGNOSIS — C44219 Basal cell carcinoma of skin of left ear and external auricular canal: Secondary | ICD-10-CM | POA: Diagnosis not present

## 2020-05-11 DIAGNOSIS — Z85828 Personal history of other malignant neoplasm of skin: Secondary | ICD-10-CM | POA: Diagnosis not present

## 2020-05-12 DIAGNOSIS — H353211 Exudative age-related macular degeneration, right eye, with active choroidal neovascularization: Secondary | ICD-10-CM | POA: Diagnosis not present

## 2020-05-25 DIAGNOSIS — L570 Actinic keratosis: Secondary | ICD-10-CM | POA: Diagnosis not present

## 2020-06-03 DIAGNOSIS — H353221 Exudative age-related macular degeneration, left eye, with active choroidal neovascularization: Secondary | ICD-10-CM | POA: Diagnosis not present

## 2020-06-04 DIAGNOSIS — M792 Neuralgia and neuritis, unspecified: Secondary | ICD-10-CM | POA: Diagnosis not present

## 2020-06-04 DIAGNOSIS — E1169 Type 2 diabetes mellitus with other specified complication: Secondary | ICD-10-CM | POA: Diagnosis not present

## 2020-06-04 DIAGNOSIS — E7800 Pure hypercholesterolemia, unspecified: Secondary | ICD-10-CM | POA: Diagnosis not present

## 2020-06-04 DIAGNOSIS — Z0000 Encounter for general adult medical examination without abnormal findings: Secondary | ICD-10-CM | POA: Diagnosis not present

## 2020-06-08 DIAGNOSIS — H35363 Drusen (degenerative) of macula, bilateral: Secondary | ICD-10-CM | POA: Diagnosis not present

## 2020-06-08 DIAGNOSIS — H35453 Secondary pigmentary degeneration, bilateral: Secondary | ICD-10-CM | POA: Diagnosis not present

## 2020-06-08 DIAGNOSIS — Z961 Presence of intraocular lens: Secondary | ICD-10-CM | POA: Diagnosis not present

## 2020-06-08 DIAGNOSIS — H353231 Exudative age-related macular degeneration, bilateral, with active choroidal neovascularization: Secondary | ICD-10-CM | POA: Diagnosis not present

## 2020-06-08 DIAGNOSIS — E119 Type 2 diabetes mellitus without complications: Secondary | ICD-10-CM | POA: Diagnosis not present

## 2020-07-06 DIAGNOSIS — H353211 Exudative age-related macular degeneration, right eye, with active choroidal neovascularization: Secondary | ICD-10-CM | POA: Diagnosis not present

## 2020-07-16 ENCOUNTER — Other Ambulatory Visit: Payer: Self-pay

## 2020-07-16 ENCOUNTER — Emergency Department (HOSPITAL_COMMUNITY): Payer: Medicare Other

## 2020-07-16 ENCOUNTER — Observation Stay (HOSPITAL_COMMUNITY): Payer: Medicare Other

## 2020-07-16 ENCOUNTER — Encounter (HOSPITAL_COMMUNITY): Payer: Self-pay | Admitting: Internal Medicine

## 2020-07-16 ENCOUNTER — Observation Stay (HOSPITAL_BASED_OUTPATIENT_CLINIC_OR_DEPARTMENT_OTHER): Payer: Medicare Other

## 2020-07-16 ENCOUNTER — Inpatient Hospital Stay (HOSPITAL_COMMUNITY)
Admission: EM | Admit: 2020-07-16 | Discharge: 2020-07-19 | DRG: 070 | Disposition: A | Discharge status: Skilled Nursing Facility | Payer: Medicare Other | Attending: Internal Medicine | Admitting: Internal Medicine

## 2020-07-16 DIAGNOSIS — Z66 Do not resuscitate: Secondary | ICD-10-CM | POA: Diagnosis not present

## 2020-07-16 DIAGNOSIS — Z20822 Contact with and (suspected) exposure to covid-19: Secondary | ICD-10-CM | POA: Diagnosis present

## 2020-07-16 DIAGNOSIS — H9190 Unspecified hearing loss, unspecified ear: Secondary | ICD-10-CM | POA: Diagnosis present

## 2020-07-16 DIAGNOSIS — J690 Pneumonitis due to inhalation of food and vomit: Secondary | ICD-10-CM | POA: Diagnosis not present

## 2020-07-16 DIAGNOSIS — H353290 Exudative age-related macular degeneration, unspecified eye, stage unspecified: Secondary | ICD-10-CM | POA: Diagnosis present

## 2020-07-16 DIAGNOSIS — R4182 Altered mental status, unspecified: Secondary | ICD-10-CM

## 2020-07-16 DIAGNOSIS — M5136 Other intervertebral disc degeneration, lumbar region: Secondary | ICD-10-CM | POA: Diagnosis present

## 2020-07-16 DIAGNOSIS — M47819 Spondylosis without myelopathy or radiculopathy, site unspecified: Secondary | ICD-10-CM | POA: Diagnosis present

## 2020-07-16 DIAGNOSIS — C61 Malignant neoplasm of prostate: Secondary | ICD-10-CM | POA: Diagnosis present

## 2020-07-16 DIAGNOSIS — G9341 Metabolic encephalopathy: Secondary | ICD-10-CM | POA: Diagnosis not present

## 2020-07-16 DIAGNOSIS — G459 Transient cerebral ischemic attack, unspecified: Secondary | ICD-10-CM | POA: Diagnosis not present

## 2020-07-16 DIAGNOSIS — R059 Cough, unspecified: Secondary | ICD-10-CM | POA: Diagnosis not present

## 2020-07-16 DIAGNOSIS — E785 Hyperlipidemia, unspecified: Secondary | ICD-10-CM | POA: Diagnosis present

## 2020-07-16 DIAGNOSIS — R001 Bradycardia, unspecified: Secondary | ICD-10-CM | POA: Diagnosis not present

## 2020-07-16 DIAGNOSIS — Z79899 Other long term (current) drug therapy: Secondary | ICD-10-CM

## 2020-07-16 DIAGNOSIS — I10 Essential (primary) hypertension: Secondary | ICD-10-CM | POA: Diagnosis present

## 2020-07-16 DIAGNOSIS — M549 Dorsalgia, unspecified: Secondary | ICD-10-CM

## 2020-07-16 DIAGNOSIS — E538 Deficiency of other specified B group vitamins: Secondary | ICD-10-CM | POA: Diagnosis not present

## 2020-07-16 DIAGNOSIS — I6389 Other cerebral infarction: Secondary | ICD-10-CM | POA: Diagnosis not present

## 2020-07-16 DIAGNOSIS — R410 Disorientation, unspecified: Secondary | ICD-10-CM | POA: Diagnosis not present

## 2020-07-16 DIAGNOSIS — E119 Type 2 diabetes mellitus without complications: Secondary | ICD-10-CM | POA: Diagnosis present

## 2020-07-16 DIAGNOSIS — H353230 Exudative age-related macular degeneration, bilateral, stage unspecified: Secondary | ICD-10-CM | POA: Diagnosis present

## 2020-07-16 DIAGNOSIS — R531 Weakness: Secondary | ICD-10-CM | POA: Diagnosis present

## 2020-07-16 DIAGNOSIS — R000 Tachycardia, unspecified: Secondary | ICD-10-CM | POA: Diagnosis not present

## 2020-07-16 DIAGNOSIS — S0990XA Unspecified injury of head, initial encounter: Secondary | ICD-10-CM | POA: Diagnosis not present

## 2020-07-16 DIAGNOSIS — R200 Anesthesia of skin: Secondary | ICD-10-CM | POA: Diagnosis present

## 2020-07-16 DIAGNOSIS — D696 Thrombocytopenia, unspecified: Secondary | ICD-10-CM | POA: Diagnosis present

## 2020-07-16 DIAGNOSIS — G9389 Other specified disorders of brain: Secondary | ICD-10-CM | POA: Diagnosis not present

## 2020-07-16 HISTORY — DX: Unspecified macular degeneration: H35.30

## 2020-07-16 HISTORY — DX: Malignant neoplasm of prostate: C61

## 2020-07-16 HISTORY — DX: Type 2 diabetes mellitus without complications: E11.9

## 2020-07-16 LAB — ECHOCARDIOGRAM COMPLETE
Area-P 1/2: 3.81 cm2
S' Lateral: 2.4 cm

## 2020-07-16 LAB — RESP PANEL BY RT-PCR (FLU A&B, COVID) ARPGX2
Influenza A by PCR: NEGATIVE
Influenza B by PCR: NEGATIVE
SARS Coronavirus 2 by RT PCR: NEGATIVE

## 2020-07-16 LAB — COMPREHENSIVE METABOLIC PANEL
ALT: 18 U/L (ref 0–44)
AST: 21 U/L (ref 15–41)
Albumin: 3.6 g/dL (ref 3.5–5.0)
Alkaline Phosphatase: 68 U/L (ref 38–126)
Anion gap: 11 (ref 5–15)
BUN: 18 mg/dL (ref 8–23)
CO2: 25 mmol/L (ref 22–32)
Calcium: 9.6 mg/dL (ref 8.9–10.3)
Chloride: 101 mmol/L (ref 98–111)
Creatinine, Ser: 0.88 mg/dL (ref 0.61–1.24)
GFR, Estimated: >60 mL/min (ref >60)
Glucose, Bld: 143 mg/dL — ABNORMAL HIGH (ref 70–99)
Potassium: 3.9 mmol/L (ref 3.5–5.1)
Sodium: 137 mmol/L (ref 135–145)
Total Bilirubin: 0.9 mg/dL (ref 0.3–1.2)
Total Protein: 6.6 g/dL (ref 6.5–8.1)

## 2020-07-16 LAB — URINALYSIS, ROUTINE W REFLEX MICROSCOPIC
Bacteria, UA: NONE SEEN
Bilirubin Urine: NEGATIVE
Glucose, UA: NEGATIVE mg/dL
Hgb urine dipstick: NEGATIVE
Ketones, ur: NEGATIVE mg/dL
Leukocytes,Ua: NEGATIVE
Nitrite: NEGATIVE
Protein, ur: 30 mg/dL — AB
Specific Gravity, Urine: 1.017 (ref 1.005–1.030)
pH: 5 (ref 5.0–8.0)

## 2020-07-16 LAB — CBC WITH DIFFERENTIAL/PLATELET
Abs Immature Granulocytes: 0.02 10*3/uL (ref 0.00–0.07)
Basophils Absolute: 0 10*3/uL (ref 0.0–0.1)
Basophils Relative: 1 %
Eosinophils Absolute: 0.1 10*3/uL (ref 0.0–0.5)
Eosinophils Relative: 1 %
HCT: 41 % (ref 39.0–52.0)
Hemoglobin: 13 g/dL (ref 13.0–17.0)
Immature Granulocytes: 0 %
Lymphocytes Relative: 15 %
Lymphs Abs: 0.9 10*3/uL (ref 0.7–4.0)
MCH: 28.1 pg (ref 26.0–34.0)
MCHC: 31.7 g/dL (ref 30.0–36.0)
MCV: 88.7 fL (ref 80.0–100.0)
Monocytes Absolute: 0.4 10*3/uL (ref 0.1–1.0)
Monocytes Relative: 7 %
Neutro Abs: 4.4 10*3/uL (ref 1.7–7.7)
Neutrophils Relative %: 76 %
Platelets: 130 10*3/uL — ABNORMAL LOW (ref 150–400)
RBC: 4.62 MIL/uL (ref 4.22–5.81)
RDW: 14.8 % (ref 11.5–15.5)
WBC: 5.8 10*3/uL (ref 4.0–10.5)
nRBC: 0 % (ref 0.0–0.2)

## 2020-07-16 MED ORDER — ROSUVASTATIN CALCIUM 5 MG PO TABS
10.0000 mg | ORAL_TABLET | Freq: Every day | ORAL | Status: DC
  Administered 2020-07-16 – 2020-07-19 (×4): 10 mg via ORAL
  Filled 2020-07-16 (×4): qty 2

## 2020-07-16 MED ORDER — SENNOSIDES-DOCUSATE SODIUM 8.6-50 MG PO TABS
1.0000 | ORAL_TABLET | Freq: Every evening | ORAL | Status: DC | PRN
  Administered 2020-07-17: 1 via ORAL
  Filled 2020-07-16: qty 1

## 2020-07-16 MED ORDER — STROKE: EARLY STAGES OF RECOVERY BOOK: Freq: Once | Status: DC

## 2020-07-16 MED ORDER — SODIUM CHLORIDE 0.9 % IV SOLN
500.0000 mg | INTRAVENOUS | Status: AC
  Administered 2020-07-16 – 2020-07-17 (×2): 500 mg via INTRAVENOUS
  Filled 2020-07-16 (×2): qty 500

## 2020-07-16 MED ORDER — SODIUM CHLORIDE 0.9 % IV SOLN: INTRAVENOUS | Status: DC

## 2020-07-16 MED ORDER — ENOXAPARIN SODIUM 40 MG/0.4ML ~~LOC~~ SOLN
40.0000 mg | SUBCUTANEOUS | Status: DC
  Administered 2020-07-16 – 2020-07-18 (×3): 40 mg via SUBCUTANEOUS
  Filled 2020-07-16 (×3): qty 0.4

## 2020-07-16 MED ORDER — ACETAMINOPHEN 650 MG RE SUPP: 650.0000 mg | RECTAL | Status: DC | PRN

## 2020-07-16 MED ORDER — ACETAMINOPHEN 325 MG PO TABS
650.0000 mg | ORAL_TABLET | ORAL | Status: DC | PRN
  Administered 2020-07-17: 650 mg via ORAL
  Filled 2020-07-16: qty 2

## 2020-07-16 MED ORDER — ASPIRIN 325 MG PO TABS
325.0000 mg | ORAL_TABLET | Freq: Every day | ORAL | Status: DC
  Administered 2020-07-16 – 2020-07-19 (×4): 325 mg via ORAL
  Filled 2020-07-16 (×4): qty 1

## 2020-07-16 MED ORDER — LORAZEPAM 2 MG/ML IJ SOLN: 1.0000 mg | Freq: Once | INTRAMUSCULAR | Status: DC

## 2020-07-16 MED ORDER — ASPIRIN 300 MG RE SUPP: 300.0000 mg | Freq: Every day | RECTAL | Status: DC

## 2020-07-16 MED ORDER — ACETAMINOPHEN 160 MG/5ML PO SOLN: 650.0000 mg | ORAL | Status: DC | PRN

## 2020-07-16 NOTE — Progress Notes (Signed)
  Echocardiogram 2D Echocardiogram has been performed.  Albert Rogers 07/16/2020, 11:11 AM

## 2020-07-16 NOTE — ED Notes (Signed)
This RN updated pt's dtr at bedside.

## 2020-07-16 NOTE — Progress Notes (Signed)
Carotid duplex bilateral study completed.   Please see CV Proc for preliminary results.   Malek Skog, RDMS, RVT  

## 2020-07-16 NOTE — ED Notes (Signed)
Pt transported to MRI at this time 

## 2020-07-16 NOTE — ED Triage Notes (Signed)
Pt BIB EMS. Pt fell at home, fire dept stood him up and placed him in bed. No LOC Per EMS, wife stated that pt has been in bed for the past 3 days, and is normally very active with ADLs. Wife also stated to EMS that pt is exhibiting personality change to irritable. Intermittent confusion noted with EMS. Presents with generalized weakness. No slurred speech or difficulty speaking.   Hx prostate cancer, DM  EMS vitals: 184/110 HR 111 CBG 200 Pulse 11 98% RA 98 temp

## 2020-07-16 NOTE — ED Notes (Signed)
Pt transported to MRI 

## 2020-07-16 NOTE — Progress Notes (Signed)
[facility] imaging downtime  No acute finding on head CT from 4:31 am today as read from scanner.  Please follow up final.  JWatts MD

## 2020-07-16 NOTE — Progress Notes (Signed)
Report called to 3W. All questions answered. Patient entered to transport for admission.

## 2020-07-16 NOTE — H&P (Addendum)
History and Physical   Albert Rogers ZJI:[id number] DOB: [birth year] DOA: 07/16/2020  PCP: Alroy Dust, L.Marlou Sa, MD Consultants:  [facility] - dermatology; [facility] - ophthalmology Patient coming from:  Home - lives with wife; NOK: Wife, Katie Moch, [phone]  Chief Complaint: Albert Rogers, AMS  HPI: [name] is a 87 y.o. male with medical history significant of DM; prostate CA; macular degeneration; presenting with fall, AMS (last known normal 2/28).  Patient is alert and oriented x3. He is conversational but unable to answer complex questions appropriately- will describe unrelated events when attempting to elicit HPI. Able to follow directions on exam.  Thorough history was obtained through phone call with his wife: Symptoms acutely started Monday.  He complained that his back was hurting and later around not feeling well most of the day.  Wife noticed that he was quiet and at baseline talks a lot.  He slept for most of the day and was only after maybe 2 hours during the day.  When he was up, he was shuffling and stumbling a lot and disoriented and confused.  He was unable to hold normal conversation but she did not notice any slurring in his speech.  Last night he was standing in doorway holding onto wall and he was shaking all over and shouted "help me". Wife tried to hold him up but he ended up falling backwards as she tried to help him slowly go to the floor and hit his head on the floor. Did not lose consciousness.  He was not able to get back up even with wife's help for about 30 minutes. Couldn't straighten or move his legs.  Called the ambulance at that time.  Sometimes uses a cane when walking prolonged distances at stores but otherwise ambulates independently at home. Of note: His right eye is his "good" eye. Patient wears a hearing aid.  PMH: Diabetic- metformin Hyperlipidemia- rosuvastatin Macular degeneration-injections in his eyes 6-12 weeks apart. Last on 21st of February. h/o  prostate cancer-injection for prostate cancer. Due on 23rd of this month next.   No known history of arrhythmia, blood clots, stroke, heart disease, kidney abnormalities.  Patient denies pain currently, abdominal pain or other sick symptoms.  Daughter coming to hospital.  ED Course:   Alert and confused on arrival after fall at home. Initial head CT negative. UA and labs ok.    Review of Systems: As per HPI; otherwise review of systems reviewed and negative.   Ambulatory Status: Ambulates without assistance at baseline at home. Uses a can occasionally for prolonged walking at stores.  COVID Vaccine Status: unknown  Past Medical History:  Diagnosis Date  . Diabetes ([place])   . Macular degeneration   . Prostate cancer [facility])    History reviewed. No pertinent surgical history.  Social History   Socioeconomic History  . Marital status: Married    Spouse name: Not on file  . Number of children: Not on file  . Years of education: Not on file  . Highest education level: Not on file  Occupational History  . Not on file  Tobacco Use  . Smoking status: Not on file  . Smokeless tobacco: Not on file  Substance and Sexual Activity  . Alcohol use: Not on file  . Drug use: Not on file  . Sexual activity: Not on file  Other Topics Concern  . Not on file  Social History Narrative  . Not on file   Social Determinants of Health   Financial Resource Strain:  Not on file  Food Insecurity: Not on file  Transportation Needs: Not on file  Physical Activity: Not on file  Stress: Not on file  Social Connections: Not on file  Intimate Partner Violence: Not on file   No Known Allergies  History reviewed. No pertinent family history.  Prior to Admission medications   Not on File   Physical Exam: Vitals:   07/16/20 0600 07/16/20 0730 07/16/20 0900 07/16/20 1000  BP: (!) 125/92 (!) 167/101 (!) 159/92 (!) 160/97  Pulse: 90 100 90 90  Resp: (!) 23 (!) 29 (!) 22 (!) 22  Temp:       TempSrc:      SpO2: 97% 96% 96% 96%   . General:  Appears calm and comfortable and is in NAD; very conversant but tangential and mildly confused . Eyes:  PERRL, EOMI, normal lids, iris . ENT:  Hard of hearing hearing, has hearing aid. lips & tongue, mmm; poor dentition . Neck:  no LAD, masses or thyromegaly . Cardiovascular:  RRR, no m/r/g. Tr LE edema.  Marland Kitchen Respiratory:   CTA bilaterally with no wheezes/rales/rhonchi.  Normal respiratory effort. Periodic cough throughout evaluation. . Abdomen:  soft, NT, ND . Skin:  no rash or induration seen on limited exam . Musculoskeletal:  grossly normal tone BUE/BLE, no bony abnormality . Lower extremity:  No LE edema.  Limited foot exam with no ulcerations.  2+ distal pulses. Marland Kitchen Psychiatric:  Conversant but tangential speech with appropriate mood and affect, speech fluent, AOx3 (when asked the year, he worked hard to come up with March and the day and eventually came to 2023) . Neurologic:  CN 2-12 grossly intact with ?subtle R facial droop vs. baseline, moves all extremities in coordinated fashion, sensation uncertain because of participation. Strength is symmetrical.  Radiological Exams on Admission: Independently reviewed - see discussion in A/P where applicable  CT Head Wo Contrast  Result Date: 07/16/2020 CLINICAL DATA:  Head trauma, minor EXAM: CT HEAD WITHOUT CONTRAST TECHNIQUE: Contiguous axial images were obtained from the base of the skull through the vertex without intravenous contrast. COMPARISON:  07/22/2004 brain MRI FINDINGS: Brain: No evidence of acute infarction, hemorrhage, hydrocephalus, extra-axial collection or mass lesion/mass effect. Cortical volume loss in keeping with age. Age congruent white matter low-density. Vascular: No hyperdense vessel or unexpected calcification. Skull: Normal. Negative for fracture or focal lesion. Sinuses/Orbits: No acute finding. Other: Case preliminarily reviewed during downtime, see epic. IMPRESSION:  No evidence of intracranial injury.  Age congruent head CT. Electronically Signed   By: Monte Fantasia M.D.   On: 07/16/2020 09:03   DG Chest Portable 1 View  Result Date: 07/16/2020 CLINICAL DATA:  Cough EXAM: PORTABLE CHEST 1 VIEW COMPARISON:  12/23/2016 FINDINGS: Normal heart size and mediastinal contours. Reticular densities in the bilateral lower lungs with some subpleural predilection. No effusion or pneumothorax. Normal heart size and mediastinal contours. Artifact from EKG leads. IMPRESSION: Subpleural reticulation on both sides of indeterminate chronicity. Please correlate for atypical pneumonia symptoms. Electronically Signed   By: Monte Fantasia M.D.   On: 07/16/2020 04:28   EKG: Independently reviewed.  NSR with rate 93; no evidence of acute ischemia  Labs on Admission: I have personally reviewed the available labs and imaging studies at the time of the admission.  Pertinent labs:  Glucose 143 Platelets 130 COVID/flu negative UA: 30 protein  Assessment/Plan Principal Problem:   Acute metabolic encephalopathy Active Problems:   Type 2 diabetes mellitus without complication (HCC)   Dyslipidemia  Exudative senile macular degeneration of retina (HCC)   Encephalopathy- Patient presenting with subacute encephalopathy primarily displayed as confusion and generalized weakness without obvious focal weakness. While the patient does likely have mild underlying dementia, this is a change compared to his usual baseline mental status.  - Stroke is suspected but initial head CT was negative for acute bleed or ischemia.   - MRA/MRI brain ordered, consult stroke team s/p results  - swallow evaluation  - PT/OT, appreciate recs; will consult CSW for placement if needed s/p evaluation  - ASA (has not failed primary prevention), statin, lovenox  - telemetry  - echo  - lipid, hgb A1c, TSH  - OOB with assistance - consider infection- cxray shows possible atypical pneumonia and patient had  cough during exam but no other signs of active infection- normal vitals and labs.   - treat cough with azithromycin x5 days for concern that atypical pneumonia could be contributing to encephalopathy.  DM- on home metformin  - hold metformin  - A1c am  HLD - on home Crestor, appears to only takes 3x/week  -will continue but change to daily  Macular degeneration - significant L eye visual impairment, less present on the R; receives injections  Prostate CA - remote h/o, followed by Dr. Alinda Money     Note: This patient has been tested and is negative for the novel coronavirus COVID-19.     DVT prophylaxis:  Lovenox  Code Status: DNR - confirmed with patient/family Family Communication: phone call to wife; daughter planning to come in to visit today in person Disposition Plan:  The patient is from: home  Anticipated d/c date will depend on clinical response to treatment, but possibly as early as tomorrow if he has excellent response to treatment  Patient is currently: acutely ill Consults called: PT/OT/ST/Nutrition; TOC team Admission status: It is my clinical opinion that referral for OBSERVATION is reasonable and necessary in this patient based on the above information provided. The aforementioned taken together are felt to place the patient at high risk for further clinical deterioration. However it is anticipated that the patient may be medically stable for discharge from the hospital within 24 to 48 hours.    Karmen Bongo MD [facility]   How to contact the [facility] Attending or Consulting provider [place] or covering provider during after hours [place], for this patient?  1. Check the care team in [facility] and look for a) attending/consulting TR[facility] provider listed and b) the [facility] team listed 2. Log into [email or web address] and use [place]'s universal password to access. If you do not have the password, please contact the hospital operator. 3. Locate the [facility] provider you are looking for  under [facility] and page to a number that you can be directly reached. 4. If you still have difficulty reaching the provider, please page the [facility] (Director on Call) for the Hospitalists listed on amion for assistance.   07/16/2020, 11:28 AM

## 2020-07-16 NOTE — ED Provider Notes (Signed)
[facility] EMERGENCY DEPARTMENT Provider Note   CSN: [id number] Arrival date & time: 07/16/20  2409     History Chief Complaint  Patient presents with  . Fall  . Altered Mental Status    Albert Rogers is a 87 y.o. male with a history of hard of hearing, prostate cancer, diabetes mellitus type 2, macular degeneration who presents to the emergency department by EMS from home with a chief complaint of confusion.  EMS reports reports that the patient's wife states that the patient became acutely confused 3 days ago.  Earlier tonight, the patient fell at home after his knees buckled while he was walking.  EMS reports that the patient did not hit his head and slid down to the ground, but they were concerned that he seemed more weak on his left side.  He seemed confused about how to put on his belt at home per EMS  EMS reports a heart rate of 110.  CBG 200.  No hypoxia.  He was hypertensive 184/110.  He is alert and oriented x3.  Patient states that he has been feeling poorly for the last 3 days.  He is also endorsing left flank pain, and states "I think my kidney is acting up."  He has a nonproductive cough at bedside, but denies chest pain, shortness of breath, fever, chills, nausea, vomiting, diarrhea, dysuria, hematuria, visual changes, neck pain, rash, numbness, or weakness.  He states that he is having some paresthesias in his left leg, but this is chronic.  He is the primary caregiver for his wife at home who has mobility issues.  The history is provided by the patient and medical records. No language interpreter was used.       No past medical history on file.  There are no problems to display for this patient.  No family history on file.     Home Medications Prior to Admission medications   Not on File    Allergies    Patient has no allergy information on record.  Review of Systems   Review of Systems  Constitutional: Negative for appetite  change, chills and fever.       Malaise  HENT: Negative for congestion and sore throat.   Respiratory: Positive for cough. Negative for shortness of breath and wheezing.   Cardiovascular: Negative for chest pain.  Gastrointestinal: Negative for abdominal pain, diarrhea, nausea and vomiting.  Genitourinary: Positive for flank pain. Negative for decreased urine volume, dysuria, penile swelling, scrotal swelling and testicular pain.  Musculoskeletal: Negative for arthralgias, back pain, myalgias, neck pain and neck stiffness.  Skin: Negative for rash.  Allergic/Immunologic: Negative for immunocompromised state.  Neurological: Negative for seizures, syncope, weakness, numbness and headaches.  Psychiatric/Behavioral: Positive for confusion. The patient is not nervous/anxious.     Physical Exam Updated Vital Signs BP (!) 125/92   Pulse 90   Temp 98.5 F (36.9 C) (Rectal)   Resp (!) 23   SpO2 97%   Physical Exam Vitals and nursing note reviewed.  Constitutional:      General: He is not in acute distress.    Appearance: He is well-developed. He is not ill-appearing, toxic-appearing or diaphoretic.     Comments: Very hard of hearing  HENT:     Head: Normocephalic.     Mouth/Throat:     Mouth: Mucous membranes are moist.  Eyes:     Conjunctiva/sclera: Conjunctivae normal.  Cardiovascular:     Rate and Rhythm: Regular rhythm. Tachycardia  present.     Heart sounds: No murmur heard. No friction rub. No gallop.   Pulmonary:     Effort: Pulmonary effort is normal. No respiratory distress.     Breath sounds: No stridor. No wheezing, rhonchi or rales.     Comments: Dry cough noted throughout exam Chest:     Chest wall: No tenderness.  Abdominal:     General: There is no distension.     Palpations: Abdomen is soft. There is no mass.     Tenderness: There is no abdominal tenderness. There is left CVA tenderness. There is no right CVA tenderness, guarding or rebound.     Hernia: No  hernia is present.     Comments: Abdomen soft, nontender, nondistended.  Left CVA tenderness.  No right CVA tenderness.  No rebound or guarding.  Musculoskeletal:     Cervical back: Neck supple.  Skin:    General: Skin is warm and dry.     Capillary Refill: Capillary refill takes less than 2 seconds.     Coloration: Skin is not jaundiced or pale.     Findings: No erythema.  Neurological:     Mental Status: He is alert.     Sensory: Sensory deficit:      Comments: Altert and oriented x3.  He knows he is at [facility].  He knows the month, day, and year.  Is able to state his first and last name.  Answers questions appropriately.  Follows simple commands.  Cranial nerves II through XII are grossly intact.  No pronator drift.  Sensation is intact and equal throughout the bilateral upper and lower extremities.  5 out of 5 strength against resistance of the bilateral upper and lower extremities. Gait exam deferred at this time.  Psychiatric:        Behavior: Behavior normal.     ED Results / Procedures / Treatments   Labs (all labs ordered are listed, but only abnormal results are displayed) Labs Reviewed  CBC WITH DIFFERENTIAL/PLATELET - Abnormal; Notable for the following components:      Result Value   Platelets 130 (*)    All other components within normal limits  COMPREHENSIVE METABOLIC PANEL - Abnormal; Notable for the following components:   Glucose, Bld 143 (*)    All other components within normal limits  URINALYSIS, ROUTINE W REFLEX MICROSCOPIC - Abnormal; Notable for the following components:   Protein, ur 30 (*)    All other components within normal limits  RESP PANEL BY RT-PCR (FLU A&B, COVID) [id number]  URINE CULTURE    EKG None  Radiology No results found.  Procedures Procedures   Medications Ordered in ED Medications - No data to display  ED Course  I have reviewed the triage vital signs and the nursing notes.  Pertinent labs & imaging results that  were available during my care of the patient were reviewed by me and considered in my medical decision making (see chart for details).    MDM Rules/Calculators/A&P                          87 year old male with a history of hard of hearing, prostate cancer, diabetes mellitus type 2, macular degeneration who presents to the emergency department by EMS from home with 3 days of confusion.  The patient also had a fall earlier tonight, but denies hitting his head.  Patient was seen and independently evaluated by Dr. Betsey Holiday, attending physician.  Tachycardic and tachypneic on arrival.  No hypoxia.  No fever with rectal temp.  Labs have been reviewed and independently interpreted by me.  COVID-19 test is negative.  CBC with thrombocytopenia, which appears chronic.  UA with mild proteinuria, but not concerning for infection.    Metabolic panel, chest x-ray, and head CT are pending.  There has been a significant delay in changing due to a systemwide problem with above labs and imaging.  However, images are unable to be viewed and results are not crossing over.  Patient care transferred to PA Harris at the end of my shift.  If work-up is unremarkable, he may require MRI or admission for further evaluation of confusion.  Social work consult is also in place because if patient requires admission then his wife may need some assistance at home.  Patient presentation, ED course, and plan of care discussed with review of all pertinent labs and imaging. Please see his/her note for further details regarding further ED course and disposition.   Final Clinical Impression(s) / ED Diagnoses Final diagnoses:  None    Rx / DC Orders ED Discharge Orders    None       Joanne Gavel, PA-C 07/16/20 7680    Orpah Greek, MD 07/17/20 [phone]

## 2020-07-16 NOTE — ED Provider Notes (Signed)
6:48 AM BP (!) 125/92   Pulse 90   Temp 98.5 F (36.9 C) (Rectal)   Resp (!) 23   SpO2 97%  Here with weakness, fatigue, not getting out of bed for several days, Fell today, Has mild confusion, abrupt personality change. Awaiing labs.  Patient also is the primary caretaker of his wife who is ? Bedbound. Needs social work consult.  Results for orders placed or performed during the hospital encounter of 07/16/20  Resp Panel by RT-PCR (Flu A&B, Covid) Nasopharyngeal Swab   Specimen: Nasopharyngeal Swab; Nasopharyngeal(NP) swabs in vial transport medium  Result Value Ref Range   SARS Coronavirus 2 by RT PCR NEGATIVE NEGATIVE   Influenza A by PCR NEGATIVE NEGATIVE   Influenza B by PCR NEGATIVE NEGATIVE  CBC with Differential  Result Value Ref Range   WBC 5.8 4.0 - 10.5 K/uL   RBC 4.62 4.22 - 5.81 MIL/uL   Hemoglobin 13.0 13.0 - 17.0 g/dL   HCT 41.0 39.0 - 52.0 %   MCV 88.7 80.0 - 100.0 fL   MCH 28.1 26.0 - 34.0 pg   MCHC 31.7 30.0 - 36.0 g/dL   RDW 14.8 11.5 - 15.5 %   Platelets 130 (L) 150 - 400 K/uL   nRBC 0.0 0.0 - 0.2 %   Neutrophils Relative % 76 %   Neutro Abs 4.4 1.7 - 7.7 K/uL   Lymphocytes Relative 15 %   Lymphs Abs 0.9 0.7 - 4.0 K/uL   Monocytes Relative 7 %   Monocytes Absolute 0.4 0.1 - 1.0 K/uL   Eosinophils Relative 1 %   Eosinophils Absolute 0.1 0.0 - 0.5 K/uL   Basophils Relative 1 %   Basophils Absolute 0.0 0.0 - 0.1 K/uL   Immature Granulocytes 0 %   Abs Immature Granulocytes 0.02 0.00 - 0.07 K/uL  Comprehensive metabolic panel  Result Value Ref Range   Sodium 137 135 - 145 mmol/L   Potassium 3.9 3.5 - 5.1 mmol/L   Chloride 101 98 - 111 mmol/L   CO2 25 22 - 32 mmol/L   Glucose, Bld 143 (H) 70 - 99 mg/dL   BUN 18 8 - 23 mg/dL   Creatinine, Ser 0.88 0.61 - 1.24 mg/dL   Calcium 9.6 8.9 - 10.3 mg/dL   Total Protein 6.6 6.5 - 8.1 g/dL   Albumin 3.6 3.5 - 5.0 g/dL   AST 21 15 - 41 U/L   ALT 18 0 - 44 U/L   Alkaline Phosphatase 68 38 - 126 U/L   Total  Bilirubin 0.9 0.3 - 1.2 mg/dL   GFR, Estimated >60 >60 mL/min   Anion gap 11 5 - 15  Urinalysis, Routine w reflex microscopic Urine, Clean Catch  Result Value Ref Range   Color, Urine YELLOW YELLOW   APPearance CLEAR CLEAR   Specific Gravity, Urine 1.017 1.005 - 1.030   pH 5.0 5.0 - 8.0   Glucose, UA NEGATIVE NEGATIVE mg/dL   Hgb urine dipstick NEGATIVE NEGATIVE   Bilirubin Urine NEGATIVE NEGATIVE   Ketones, ur NEGATIVE NEGATIVE mg/dL   Protein, ur 30 (A) NEGATIVE mg/dL   Nitrite NEGATIVE NEGATIVE   Leukocytes,Ua NEGATIVE NEGATIVE   WBC, UA 0-5 0 - 5 WBC/hpf   Bacteria, UA NONE SEEN NONE SEEN   Squamous Epithelial / LPF 0-5 0 - 5   Mucus PRESENT    Hyaline Casts, UA PRESENT   ECHOCARDIOGRAM COMPLETE  Result Value Ref Range   BP 160/97 mmHg    Patient will  be admitted to the hospitalist service.  He is currently confused.  On my examination patient appears to have left upper and lower extremity weakness.  I had a long discussion with his wife who states that he has been having significant change in his balance over the past week and she has been very afraid that he would fall.  She states he has been very confused today when he fell and hit his head he did not remember that he hit his head..  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  This represents an acute mental status change.  I reviewed the patient's labs which show no significant abnormality and do not seem to be contributing to this change in mentation.  CT imaging shows no acute abnormalities.  Have concerned he may have had a remote infarct as the cause of the weakness in the left upper and lower extremity.  Patient will be admitted for further evaluation of mental status change and weakness.   Margarita Mail, PA-C 07/16/20 1243    Pattricia Boss, MD 07/16/20 [phone]

## 2020-07-17 ENCOUNTER — Observation Stay (HOSPITAL_COMMUNITY): Payer: Medicare Other

## 2020-07-17 DIAGNOSIS — R4182 Altered mental status, unspecified: Secondary | ICD-10-CM | POA: Diagnosis present

## 2020-07-17 DIAGNOSIS — R200 Anesthesia of skin: Secondary | ICD-10-CM | POA: Diagnosis present

## 2020-07-17 DIAGNOSIS — E538 Deficiency of other specified B group vitamins: Secondary | ICD-10-CM | POA: Diagnosis present

## 2020-07-17 DIAGNOSIS — M47819 Spondylosis without myelopathy or radiculopathy, site unspecified: Secondary | ICD-10-CM | POA: Diagnosis present

## 2020-07-17 DIAGNOSIS — Z20822 Contact with and (suspected) exposure to covid-19: Secondary | ICD-10-CM | POA: Diagnosis present

## 2020-07-17 DIAGNOSIS — Z7401 Bed confinement status: Secondary | ICD-10-CM | POA: Diagnosis not present

## 2020-07-17 DIAGNOSIS — M6281 Muscle weakness (generalized): Secondary | ICD-10-CM | POA: Diagnosis not present

## 2020-07-17 DIAGNOSIS — C61 Malignant neoplasm of prostate: Secondary | ICD-10-CM | POA: Diagnosis present

## 2020-07-17 DIAGNOSIS — R278 Other lack of coordination: Secondary | ICD-10-CM | POA: Diagnosis not present

## 2020-07-17 DIAGNOSIS — H9190 Unspecified hearing loss, unspecified ear: Secondary | ICD-10-CM | POA: Diagnosis present

## 2020-07-17 DIAGNOSIS — Z9181 History of falling: Secondary | ICD-10-CM | POA: Diagnosis not present

## 2020-07-17 DIAGNOSIS — R2681 Unsteadiness on feet: Secondary | ICD-10-CM | POA: Diagnosis not present

## 2020-07-17 DIAGNOSIS — E119 Type 2 diabetes mellitus without complications: Secondary | ICD-10-CM

## 2020-07-17 DIAGNOSIS — M545 Low back pain, unspecified: Secondary | ICD-10-CM

## 2020-07-17 DIAGNOSIS — H353230 Exudative age-related macular degeneration, bilateral, stage unspecified: Secondary | ICD-10-CM | POA: Diagnosis present

## 2020-07-17 DIAGNOSIS — M5136 Other intervertebral disc degeneration, lumbar region: Secondary | ICD-10-CM | POA: Diagnosis present

## 2020-07-17 DIAGNOSIS — G9340 Encephalopathy, unspecified: Secondary | ICD-10-CM | POA: Diagnosis not present

## 2020-07-17 DIAGNOSIS — G9341 Metabolic encephalopathy: Secondary | ICD-10-CM | POA: Diagnosis present

## 2020-07-17 DIAGNOSIS — I10 Essential (primary) hypertension: Secondary | ICD-10-CM | POA: Diagnosis present

## 2020-07-17 DIAGNOSIS — Z66 Do not resuscitate: Secondary | ICD-10-CM | POA: Diagnosis present

## 2020-07-17 DIAGNOSIS — J690 Pneumonitis due to inhalation of food and vomit: Secondary | ICD-10-CM | POA: Diagnosis present

## 2020-07-17 DIAGNOSIS — M5459 Other low back pain: Secondary | ICD-10-CM | POA: Diagnosis not present

## 2020-07-17 DIAGNOSIS — E785 Hyperlipidemia, unspecified: Secondary | ICD-10-CM | POA: Diagnosis present

## 2020-07-17 DIAGNOSIS — R404 Transient alteration of awareness: Secondary | ICD-10-CM | POA: Diagnosis not present

## 2020-07-17 DIAGNOSIS — H353290 Exudative age-related macular degeneration, unspecified eye, stage unspecified: Secondary | ICD-10-CM | POA: Diagnosis not present

## 2020-07-17 DIAGNOSIS — D696 Thrombocytopenia, unspecified: Secondary | ICD-10-CM | POA: Diagnosis present

## 2020-07-17 DIAGNOSIS — W19XXXD Unspecified fall, subsequent encounter: Secondary | ICD-10-CM | POA: Diagnosis not present

## 2020-07-17 DIAGNOSIS — R41841 Cognitive communication deficit: Secondary | ICD-10-CM | POA: Diagnosis not present

## 2020-07-17 DIAGNOSIS — Z79899 Other long term (current) drug therapy: Secondary | ICD-10-CM | POA: Diagnosis not present

## 2020-07-17 DIAGNOSIS — M2550 Pain in unspecified joint: Secondary | ICD-10-CM | POA: Diagnosis not present

## 2020-07-17 DIAGNOSIS — R531 Weakness: Secondary | ICD-10-CM | POA: Diagnosis present

## 2020-07-17 LAB — GLUCOSE, CAPILLARY
Glucose-Capillary: 121 mg/dL — ABNORMAL HIGH (ref 70–99)
Glucose-Capillary: 149 mg/dL — ABNORMAL HIGH (ref 70–99)

## 2020-07-17 LAB — URINE CULTURE: Culture: <10000 — AB

## 2020-07-17 LAB — VITAMIN B12: Vitamin B-12: 296 pg/mL (ref 180–914)

## 2020-07-17 LAB — CK: Total CK: 41 U/L — ABNORMAL LOW (ref 49–397)

## 2020-07-17 LAB — LIPID PANEL
Cholesterol: 156 mg/dL (ref 0–200)
HDL: 47 mg/dL (ref >40)
LDL Cholesterol: 81 mg/dL (ref 0–99)
Total CHOL/HDL Ratio: 3.3 RATIO
Triglycerides: 138 mg/dL (ref <150)
VLDL: 28 mg/dL (ref 0–40)

## 2020-07-17 LAB — HEMOGLOBIN A1C
Hgb A1c MFr Bld: 6.8 % — ABNORMAL HIGH (ref 4.8–5.6)
Mean Plasma Glucose: 148.46 mg/dL

## 2020-07-17 LAB — TSH: TSH: 1.468 u[IU]/mL (ref 0.350–4.500)

## 2020-07-17 MED ORDER — AMOXICILLIN-POT CLAVULANATE 875-125 MG PO TABS
1.0000 | ORAL_TABLET | Freq: Two times a day (BID) | ORAL | Status: DC
  Administered 2020-07-17 – 2020-07-19 (×5): 1 via ORAL
  Filled 2020-07-17 (×5): qty 1

## 2020-07-17 MED ORDER — NEPRO/CARBSTEADY PO LIQD
237.0000 mL | Freq: Two times a day (BID) | ORAL | Status: DC
  Administered 2020-07-17 – 2020-07-19 (×3): 237 mL via ORAL

## 2020-07-17 MED ORDER — ADULT MULTIVITAMIN W/MINERALS CH
1.0000 | ORAL_TABLET | Freq: Every day | ORAL | Status: DC
  Administered 2020-07-17 – 2020-07-19 (×3): 1 via ORAL
  Filled 2020-07-17 (×3): qty 1

## 2020-07-17 MED ORDER — MELATONIN 5 MG PO TABS
5.0000 mg | ORAL_TABLET | Freq: Every evening | ORAL | Status: DC | PRN
  Administered 2020-07-17 – 2020-07-18 (×3): 5 mg via ORAL
  Filled 2020-07-17 (×3): qty 1

## 2020-07-17 NOTE — NC FL2 (Signed)
[place] MEDICAID FL2 LEVEL OF CARE SCREENING TOOL     IDENTIFICATION  Patient Name: Albert Rogers Birthdate: [birth year] Sex: male Admission Date (Current Location): 07/16/2020  [facility] [place] Number:  Herbalist and Address:  [facility], [place] [address] [place], [place] [address]      Provider Number: [id number]  Attending Physician Name and Address:  Caren Griffins, MD  Relative Name and Phone Number:       Current Level of Care: Hospital Recommended Level of Care: [place] Prior Approval Number:    Date Approved/Denied:   PASRR Number: [id number]  Discharge Plan: SNF    Current Diagnoses: Patient Active Problem List   Diagnosis Date Noted  . AMS (altered mental status) 07/17/2020  . Acute metabolic encephalopathy 73/71/0626  . Type 2 diabetes mellitus without complication ([place]) 94/85/4627  . Dyslipidemia 07/16/2020  . Exudative senile macular degeneration of retina ([place]) 07/16/2020    Orientation RESPIRATION BLADDER Height & Weight     Self,Time,Situation,Place  Normal Incontinent Weight: 183 lb 3.2 oz (83.1 kg) Height:  5' 10.75" (179.7 cm) (from 06/04/20 encounter)  BEHAVIORAL SYMPTOMS/MOOD NEUROLOGICAL BOWEL NUTRITION STATUS      Continent Diet (see DC summary)  AMBULATORY STATUS COMMUNICATION OF NEEDS Skin   Extensive Assist Verbally Normal                       Personal Care Assistance Level of Assistance  Bathing,Feeding,Dressing Bathing Assistance: Limited assistance Feeding assistance: Independent Dressing Assistance: Limited assistance     Functional Limitations Info  Hearing,Sight Sight Info: Impaired (macular degeneration) Hearing Info: Impaired (hard of hearing)      [facility]  PT (By licensed PT),OT (By licensed OT)     PT Frequency: 5x/wk OT Frequency: 5x/wk            Contractures Contractures Info: Not present    Additional Factors  Info  Code Status,Allergies Code Status Info: DNR Allergies Info: NKA           Current Medications (07/17/2020):  This is the current hospital active medication list Current Facility-Administered Medications  Medication Dose Route Frequency Provider Last Rate Last Admin  .  stroke: mapping our early stages of recovery book   Does not apply Once Karmen Bongo, MD      . 0.9 %  sodium chloride infusion   Intravenous Continuous Karmen Bongo, MD 50 mL/hr at 07/17/20 0458 Infusion Verify at 07/17/20 0458  . acetaminophen (TYLENOL) tablet 650 mg  650 mg Oral Q4H PRN Karmen Bongo, MD   650 mg at 07/17/20 0314   Or  . acetaminophen (TYLENOL) 160 MG/5ML solution 650 mg  650 mg Per Tube Q4H PRN Karmen Bongo, MD       Or  . acetaminophen (TYLENOL) suppository 650 mg  650 mg Rectal Q4H PRN Karmen Bongo, MD      . amoxicillin-clavulanate (AUGMENTIN) 875-125 MG per tablet 1 tablet  1 tablet Oral Q12H Caren Griffins, MD   1 tablet at 07/17/20 1137  . aspirin suppository 300 mg  300 mg Rectal Daily Karmen Bongo, MD       Or  . aspirin tablet 325 mg  325 mg Oral Daily Karmen Bongo, MD   325 mg at 07/17/20 1137  . enoxaparin (LOVENOX) injection 40 mg  40 mg Subcutaneous Q24H Karmen Bongo, MD   40 mg at 07/17/20 1138  . feeding supplement (  NEPRO CARB STEADY) liquid 237 mL  237 mL Oral BID BM Gherghe, Costin M, MD      . LORazepam (ATIVAN) injection 1 mg  1 mg Intravenous Once Karmen Bongo, MD      . melatonin tablet 5 mg  5 mg Oral QHS PRN Shela Leff, MD   5 mg at 07/17/20 0250  . multivitamin with minerals tablet 1 tablet  1 tablet Oral Daily Caren Griffins, MD   1 tablet at 07/17/20 1137  . rosuvastatin (CRESTOR) tablet 10 mg  10 mg Oral Daily Karmen Bongo, MD   10 mg at 07/17/20 1137  . senna-docusate (Senokot-S) tablet 1 tablet  1 tablet Oral QHS PRN Karmen Bongo, MD         Discharge Medications: Please see discharge summary for a list of discharge  medications.  Relevant Imaging Results:  Relevant Lab Results:   Additional Information SS#: [id number]  Geralynn Ochs, LCSW

## 2020-07-17 NOTE — Evaluation (Signed)
Occupational Therapy Evaluation Patient Details Name: Albert Rogers MRN: [id number] DOB: 1932/08/12 Today's Date: 07/17/2020    History of Present Illness 87 year old male with history of DM, prostate cancer, macular degeneration presenting to the hospital with slightly more confusion and poor balance and s/p fall at home.Generalized weakness, subacute metabolic encephalopathy -not entirely clear as to his etiology.  He does not have any obvious focal weakness and MRI of the brain was negative.   Clinical Impression   [name] is an 87 year old man who presents with generalized weakness, decreased activity tolerance and impaired balance resulting in a significant decline in functional abilities. Patient typically lives at home with his wife, is a caretaker for his wife, drives and ambulates with a cane in the community. On evaluation patient required max assist x 2 to transfer to side of bed, mod-max assist for sit to stands and transfers, use of RW that he typically doesn't use and mod-max assist for LB ADLs and toileting. Patient limited to sitting and set up for UB ADLs due to impaired balance.  Patient will benefit from skilled OT services while in hospital to improve deficits and learn compensatory strategies as needed in order to return to PLOF. Patient will benefit from short term rehab at discharge.      Follow Up Recommendations  SNF    Equipment Recommendations  None recommended by OT    Recommendations for Other Services       Precautions / Restrictions Precautions Precautions: Fall Restrictions Weight Bearing Restrictions: No      Mobility Bed Mobility Overal bed mobility: Needs Assistance Bed Mobility: Supine to Sit     Supine to sit: +2 for physical assistance;+2 for safety/equipment;Max assist     General bed mobility comments: Required assistance for LEs, trunk lift and negotiation and use of bed pad to scoot edge of bed. Initially reported being very dizzy  on sitting up and exhibited some difficulty motor planning LEs.    Transfers Overall transfer level: Needs assistance Equipment used: Rolling walker (2 wheeled) Transfers: Sit to/from [facility];Lateral/Scoot Transfers Sit to Stand: Mod assist;+2 physical assistance;+2 safety/equipment;From elevated surface;Max assist Stand pivot transfers: Mod assist;+2 physical assistance;+2 safety/equipment      Lateral/Scoot Transfers: Mod assist;+2 physical assistance;+2 safety/equipment General transfer comment: Multiple sit to stands performed with assistance of two therapist. Initially feet sliding forward and patient exhibiting difficulty keeping feet on floor. First stand max x 2 to block feet, power up, maintain balance and hold onto walker. Patient fearful with stand and thinking he was going to fall. Ultimately returned to seated position.Attempted stand pivot wihtout walker to chair and unable to get patient until full standing as his legs kicked out again. Performed lateral scooting to his right into chair with mod x 2. Attempted stand from recliner and patient assited with UEs and then able to take steps forward a couple of feet with walker +2 assistance. Patient reports both legs felt weak.    Balance Overall balance assessment: Needs assistance Sitting-balance support: No upper extremity supported;Feet supported Sitting balance-Leahy Scale: Fair Sitting balance - Comments: min guard for safety   Standing balance support: Bilateral upper extremity supported;During functional activity Standing balance-Leahy Scale: Poor                             ADL either performed or assessed with clinical judgement   ADL Overall ADL's : Needs assistance/impaired Eating/Feeding: Independent  Grooming: Set up;Sitting   Upper Body Bathing: Set up;Sitting   Lower Body Bathing: Moderate assistance;Sit to/from stand;+2 for physical assistance;+2 for safety/equipment    Upper Body Dressing : Set up;Sitting   Lower Body Dressing: Moderate assistance;+2 for safety/equipment;+2 for physical assistance;Sit to/from stand   Toilet Transfer: Moderate assistance;+2 for physical assistance;+2 for safety/equipment;Stand-pivot;BSC   Toileting- Clothing Manipulation and Hygiene: Maximal assistance;Sit to/from stand;+2 for safety/equipment;+2 for physical assistance               Vision Patient Visual Report: No change from baseline       Perception     Praxis      Pertinent Vitals/Pain Pain Assessment: Faces Faces Pain Scale: Hurts a little bit Pain Location: Left lower leg Pain Descriptors / Indicators: Grimacing Pain Intervention(s): Limited activity within patient's tolerance     Hand Dominance Right   Extremity/Trunk Assessment Upper Extremity Assessment Upper Extremity Assessment: Overall WFL for tasks assessed;RUE deficits/detail;LUE deficits/detail RUE Deficits / Details: WFL ROM and strength - right hand tremor reports "it does that when I'm stressed" RUE Sensation: WNL RUE Coordination: WNL LUE Deficits / Details: WFL ROM and strength LUE Sensation: WNL LUE Coordination: WNL   Lower Extremity Assessment Lower Extremity Assessment: Defer to PT evaluation   Cervical / Trunk Assessment Cervical / Trunk Assessment: Normal   Communication Communication Communication: HOH   Cognition Arousal/Alertness: Awake/alert Behavior During Therapy: WFL for tasks assessed/performed Overall Cognitive Status: Difficult to assess                                 General Comments: Difficult to assess due to [facility]. Alert to self, place and situation.   General Comments       Exercises     Shoulder Instructions      Home Living Family/patient expects to be discharged to:: Skilled nursing facility Living Arrangements: Spouse/significant other Available Help at Discharge: Family                         Home  Equipment: Gilford Rile - 2 wheels;Cane - single point          Prior Functioning/Environment Level of Independence: Independent with assistive device(s)  Gait / Transfers Assistance Needed: independent with cane. uses buggy at the grocery store.              OT Problem List: Decreased strength;Decreased activity tolerance;Impaired balance (sitting and/or standing);Decreased safety awareness;Decreased knowledge of use of DME or AE      OT Treatment/Interventions: Self-care/ADL training;Therapeutic exercise;DME and/or AE instruction;Therapeutic activities;Balance training;Patient/family education    OT Goals(Current goals can be found in the care plan section) Acute Rehab OT Goals Patient Stated Goal: "I need some therapy" OT Goal Formulation: With patient/family Time For Goal Achievement: 07/31/20 Potential to Achieve Goals: Good  OT Frequency: Min 2X/week   Barriers to D/C: Decreased caregiver support          Co-evaluation PT/OT/SLP Co-Evaluation/Treatment: Yes Reason for Co-Treatment: To address functional/ADL transfers;For patient/therapist safety PT goals addressed during session: Mobility/safety with mobility OT goals addressed during session: ADL's and self-care      AM-PAC OT "6 Clicks" Daily Activity     Outcome Measure Help from another person eating meals?: None Help from another person taking care of personal grooming?: A Little Help from another person toileting, which includes using toliet, bedpan, or urinal?: A Lot Help from another person  bathing (including washing, rinsing, drying)?: A Lot Help from another person to put on and taking off regular upper body clothing?: A Little Help from another person to put on and taking off regular lower body clothing?: A Lot 6 Click Score: 16   End of Session Equipment Utilized During Treatment: Gait belt;Rolling walker Nurse Communication: Mobility status  Activity Tolerance: Patient tolerated treatment well Patient  left: in chair;with call bell/phone within reach;with chair alarm set;with family/visitor present  OT Visit Diagnosis: Unsteadiness on feet (R26.81);Other abnormalities of gait and mobility (R26.89);History of falling (Z91.81);Muscle weakness (generalized) (M62.81);Low vision, both eyes (H54.2);Dizziness and giddiness (R42)                Time: 4010-2725 OT Time Calculation (min): 37 min Charges:  OT General Charges $OT Visit: 1 Visit OT Evaluation $OT Eval Moderate Complexity: 1 Mod  Sharalee Witman, OTR/L [place]  Office [phone] Pager: [place] 07/17/2020, 11:24 AM

## 2020-07-17 NOTE — TOC Initial Note (Addendum)
Transition of Care [facility]) - Initial/Assessment Note    Patient Details  Name: Albert Rogers MRN: [id number] Date of Birth: 17-Mar-1933  Transition of Care [facility]) CM/SW Contact:    Geralynn Ochs, LCSW Phone Number: 07/17/2020, 3:36 PM  Clinical Narrative:    CSW met with patient and daughter, Albert Rogers, at bedside to discuss recommendation for SNF. Patient agreeable to SNF, requesting close to home but has no preferences. CSW explained referral process and insurance coverage, received permission to fax out referral. CSW to send out information and will follow back up with bed offers. Patient is fully vaccinated and boosted.               Expected Discharge Plan: Skilled Nursing Facility Barriers to Discharge: Continued Medical Work up   Patient Goals and CMS Choice Patient states their goals for this hospitalization and ongoing recovery are:: to get rehab to get back home CMS Medicare.gov Compare Post Acute Care list provided to:: Patient Choice offered to / list presented to : Patient  Expected Discharge Plan and Services Expected Discharge Plan: [place] Choice: [place] Living arrangements for the past 2 months: Single Family Home                                      Prior Living Arrangements/Services Living arrangements for the past 2 months: Single Family Home Lives with:: Spouse Patient language and need for interpreter reviewed:: No Do you feel safe going back to the place where you live?: Yes      Need for Family Participation in Patient Care: No (Comment) Care giver support system in place?: No (comment) Current home services: DME Criminal Activity/Legal Involvement Pertinent to Current Situation/Hospitalization: No - Comment as needed  Activities of Daily Living      Permission Sought/Granted Permission sought to share information with : Facility Retail banker  granted to share information with : Yes, Verbal Permission Granted  Share Information with NAME: Karene Fry  Permission granted to share info w AGENCY: SNF  Permission granted to share info w Relationship: Spouse, Daughter     Emotional Assessment Appearance:: Appears stated age Attitude/Demeanor/Rapport: Engaged Affect (typically observed): Appropriate Orientation: : Oriented to Self,Oriented to Place,Oriented to  Time,Oriented to Situation Alcohol / Substance Use: Not Applicable Psych Involvement: No (comment)  Admission diagnosis:  Altered mental status, unspecified altered mental status type [V56.43] Acute metabolic encephalopathy [P29.51] AMS (altered mental status) [R41.82] Patient Active Problem List   Diagnosis Date Noted  . AMS (altered mental status) 07/17/2020  . Acute metabolic encephalopathy 88/41/6606  . Type 2 diabetes mellitus without complication ([place]) 30/16/0109  . Dyslipidemia 07/16/2020  . Exudative senile macular degeneration of retina ([place]) 07/16/2020   PCP:  Alroy Dust, L.Marlou Sa, MD Pharmacy:   [id number] [id number]: [phone]: [phone]    Social Determinants of Health (SDOH) Interventions    Readmission Risk Interventions No flowsheet data found.

## 2020-07-17 NOTE — Progress Notes (Signed)
Initial Nutrition Assessment  DOCUMENTATION CODES:   Not applicable  INTERVENTION:   -Nepro Shake po BID, each supplement provides 425 kcal and 19 grams protein -MVI with minerals daily -Magic cup TID with meals, each supplement provides 290 kcal and 9 grams of protein  NUTRITION DIAGNOSIS:   Predicted suboptimal nutrient intake related to dysphagia as evidenced by per patient/family report.  GOAL:   Patient will meet greater than or equal to 90% of their needs  MONITOR:   PO intake,Supplement acceptance,Labs,Weight trends,Skin,I & O's  REASON FOR ASSESSMENT:   Consult Assessment of nutrition requirement/status  ASSESSMENT:   Albert Rogers is a 87 y.o. male with medical history significant of DM; prostate CA; macular degeneration; presenting with fall, AMS (last known normal 2/28).  Pt admitted with subacute encephalopathy, confusion, and generalized wekaess.   Reviewed I/O's: +366 ml x  24 hours  UOP: 900 ml x 24 hours  Pt unavailable at time of visit. Per chart review, pt with confusion.   Per MD notes, pt daughter reports a chronic cough after eating. SLP evaluation is pending. No meal completion data available at this time.   Reviewed [facility] records, last recorded wt was 196.8# on 06/04/20. Pt has experienced a 7.2% wt loss over the past 2 months, which is concerning for time frame.   Medications reviewed and include 0.9% sodium chloride infusion @ 50 ml/hr.   Lab Results  Component Value Date   HGBA1C 6.8 (H) 07/17/2020   PTA DM medications are 500 mg metformin BID.   Labs reviewed.   Diet Order:   Diet Order            Diet heart healthy/carb modified Room service appropriate? Yes; Fluid consistency: Thin  Diet effective ____                 EDUCATION NEEDS:   No education needs have been identified at this time  Skin:  Skin Assessment: Reviewed RN Assessment  Last BM:  Unknown  Height:   Ht Readings from Last 1 Encounters:   07/17/20 5' 10.75" (1.797 m)    Weight:   Wt Readings from Last 1 Encounters:  07/16/20 83.1 kg    Ideal Body Weight:  77.5 kg  BMI:  Body mass index is 25.73 kg/m.  Estimated Nutritional Needs:   Kcal:  1900-2100  Protein:  100-115 grams  Fluid:  > 1.9 L    Loistine Chance, RD, LDN, [place] Registered Dietitian II Certified Diabetes Care and Education Specialist Please refer to [facility] for RD and/or RD on-call/weekend/after hours pager

## 2020-07-17 NOTE — Progress Notes (Signed)
PROGRESS NOTE  Albert Rogers HER:[id number] DOB: [birth year] DOA: 07/16/2020 PCP: Alroy Dust, L.Marlou Sa, MD   LOS: 0 days   Brief Narrative / Interim history: 87 year old male with history of DM, prostate cancer, macular degeneration presenting to the hospital with slightly more confusion and poor balance.  Daughter tells me that on Monday he has been complaining of back pain.  He has a history of leg pain and he has been complaining of that for several years but the back pain appears to be new.  Following that, he kept saying that he was just not feeling good, unable to elaborate.  Family observed him to have significantly worsening balance and appeared to be confused and disoriented in space and not knowing where to head.  While normally he uses a cane he was shuffling, stumbling a lot, holding onto the walls.  There is no noted focal weakness or speech slurring.  Daughter reports a chronic cough especially after he is eating, but denies any fever or chills.  Subjective / 24h Interval events: He is very hard of hearing this morning, but overall doing well.  Daughter is at bedside.  He denies any pain, no shortness of breath  Assessment & Plan: Principal Problem Generalized weakness, subacute metabolic encephalopathy -not entirely clear as to his etiology.  He does not have any obvious focal weakness and MRI of the brain was negative.  I wonder whether he has mild underlying dementia that is barely noticeable by family.  PT evaluated patient this morning and based on my bedside discussion with the physical therapist recommending SNF since he requires 2 person assist and there is no help at home.  He is definitely weaker than his baseline currently.  Check vitamin B12, check CK, check orthostatic vital signs and continue to monitor on telemetry.  Daughter feels he may have been slightly dehydrated, his kidney function normal but will keep on IV fluids  Active Problems Back pain-does not seem to have  issues today but for completeness will obtain imaging with a plain x-ray  Hyperlipidemia-continue home statin  Possible aspiration pneumonia-no significant clear-cut pneumonia type symptoms however it may be early.  Chest x-ray raise concern for infiltrate, started on azithromycin, add Augmentin today due to concern for aspiration  Type 2 diabetes mellitus-A1c 6.8, will check CBGs ensure he does not have hypoglycemia  Macular degeneration -noted  Prostate cancer -noted   Scheduled Meds: .  stroke: mapping our early stages of recovery book   Does not apply Once  . amoxicillin-clavulanate  1 tablet Oral Q12H  . aspirin  300 mg Rectal Daily   Or  . aspirin  325 mg Oral Daily  . enoxaparin (LOVENOX) injection  40 mg Subcutaneous Q24H  . LORazepam  1 mg Intravenous Once  . rosuvastatin  10 mg Oral Daily   Continuous Infusions: . sodium chloride 50 mL/hr at 07/17/20 0458  . azithromycin Stopped (07/16/20 1421)   PRN Meds:.acetaminophen **OR** acetaminophen (TYLENOL) oral liquid 160 mg/5 mL **OR** acetaminophen, melatonin, senna-docusate  Diet Orders (From admission, onward)    Start     Ordered   07/16/20 1017  Diet heart healthy/carb modified Room service appropriate? Yes; Fluid consistency: Thin  Diet effective ____       Comments: Effective once patient passes swallow evaluation  Question Answer Comment  Diet-HS Snack? Nothing   Room service appropriate? Yes   Fluid consistency: Thin      07/16/20 1019          DVT  prophylaxis: enoxaparin (LOVENOX) injection 40 mg Start: 07/16/20 1100     Code Status: DNR  Family Communication: daughter at bedside   Status is: Observation  The patient will require care spanning > 2 midnights and should be moved to inpatient because: Unsafe d/c plan  Dispo: The patient is from: Home              Anticipated d/c is to: SNF              Patient currently is not medically stable to d/c.   Difficult to place patient No  Level of  care: Telemetry Medical  Consultants:  None   Procedures:  2D echo  Microbiology  None   Antimicrobials: Azithromycin / Augmentin    Objective: Vitals:   07/16/20 2320 07/17/20 0001 07/17/20 0335 07/17/20 0734  BP: (!) 170/102 (!) 162/95 (!) 138/94 (!) 164/94  Pulse: 86 82 82 91  Resp: 17 18 17 18   Temp: (!) 97.5 F (36.4 C) (!) 97.5 F (36.4 C) 97.9 F (36.6 C) 97.8 F (36.6 C)  TempSrc: Oral Oral Oral Oral  SpO2: 95% 96% 97% 95%  Weight:        Intake/Output Summary (Last 24 hours) at 07/17/2020 1003 Last data filed at 07/17/2020 0458 Gross per 24 hour  Intake 1265.47 ml  Output 900 ml  Net 365.47 ml   Filed Weights   07/16/20 1949  Weight: 83.1 kg    Examination:  Constitutional: NAD Eyes: no scleral icterus ENMT: Mucous membranes are moist.  Neck: normal, supple Respiratory: clear to auscultation bilaterally, no wheezing, no crackles. Normal respiratory effort. Cardiovascular: Regular rate and rhythm, no murmurs / rubs / gallops. No LE edema.  Abdomen: non distended, no tenderness. Bowel sounds positive.  Musculoskeletal: no clubbing / cyanosis.  Skin: no rashes Neurologic: Nonfocal, follows commands somewhat inconsistently due to hard of hearing  Data Reviewed: I have independently reviewed following labs and imaging studies   CBC: Recent Labs  Lab 07/16/20 0357  WBC 5.8  NEUTROABS 4.4  HGB 13.0  HCT 41.0  MCV 88.7  PLT 242*   Basic Metabolic Panel: Recent Labs  Lab 07/16/20 0357  NA 137  K 3.9  CL 101  CO2 25  GLUCOSE 143*  BUN 18  CREATININE 0.88  CALCIUM 9.6   Liver Function Tests: Recent Labs  Lab 07/16/20 0357  AST 21  ALT 18  ALKPHOS 68  BILITOT 0.9  PROT 6.6  ALBUMIN 3.6   Coagulation Profile: No results for input(s): INR, PROTIME in the last 168 hours. HbA1C: Recent Labs    07/17/20 0424  HGBA1C 6.8*   CBG: No results for input(s): GLUCAP in the last 168 hours.  Recent Results (from the past 240 hour(s))   Resp Panel by RT-PCR (Flu A&B, Covid) Nasopharyngeal Swab     Status: None   Collection Time: 07/16/20  4:00 AM   Specimen: Nasopharyngeal Swab; Nasopharyngeal(NP) swabs in vial transport medium  Result Value Ref Range Status   SARS Coronavirus 2 by RT PCR NEGATIVE NEGATIVE Final    Comment: (NOTE) SARS-CoV-2 target nucleic acids are NOT DETECTED.  The SARS-CoV-2 RNA is generally detectable in upper respiratory specimens during the acute phase of infection. The lowest concentration of SARS-CoV-2 viral copies this assay can detect is 138 copies/mL. A negative result does not preclude SARS-Cov-2 infection and should not be used as the sole basis for treatment or other patient management decisions. A negative result may occur with  improper specimen collection/handling, submission of specimen other than nasopharyngeal swab, presence of viral mutation(s) within the areas targeted by this assay, and inadequate number of viral copies(<138 copies/mL). A negative result must be combined with clinical observations, patient history, and epidemiological information. The expected result is Negative.  Fact Sheet for Patients:  EntrepreneurPulse.com.au  Fact Sheet for Healthcare Providers:  IncredibleEmployment.be  This test is no t yet approved or cleared by the [place] FDA and  has been authorized for detection and/or diagnosis of SARS-CoV-2 by FDA under an Emergency Use Authorization (EUA). This EUA will remain  in effect (meaning this test can be used) for the duration of the COVID-19 declaration under Section 564(b)(1) of the Act, 21 U.S.C.section 360bbb-3(b)(1), unless the authorization is terminated  or revoked sooner.       Influenza A by PCR NEGATIVE NEGATIVE Final   Influenza B by PCR NEGATIVE NEGATIVE Final    Comment: (NOTE) The Xpert Xpress SARS-CoV-2/FLU/RSV plus assay is intended as an aid in the diagnosis of influenza from  Nasopharyngeal swab specimens and should not be used as a sole basis for treatment. Nasal washings and aspirates are unacceptable for Xpert Xpress SARS-CoV-2/FLU/RSV testing.  Fact Sheet for Patients: EntrepreneurPulse.com.au  Fact Sheet for Healthcare Providers: IncredibleEmployment.be  This test is not yet approved or cleared by the [place] FDA and has been authorized for detection and/or diagnosis of SARS-CoV-2 by FDA under an Emergency Use Authorization (EUA). This EUA will remain in effect (meaning this test can be used) for the duration of the COVID-19 declaration under Section 564(b)(1) of the Act, 21 U.S.C. section 360bbb-3(b)(1), unless the authorization is terminated or revoked.  Performed at [facility], [place] [address], [place], [place] [address]   Urine culture     Status: Abnormal   Collection Time: 07/16/20  5:18 AM   Specimen: Urine, Random  Result Value Ref Range Status   Specimen Description URINE, RANDOM  Final   Special Requests NONE  Final   Culture (A)  Final    <10,000 COLONIES/mL INSIGNIFICANT GROWTH Performed at [facility], [place] [address] [place],  [address]    Report Status 07/17/2020 FINAL  Final     Radiology Studies: MR ANGIO HEAD WO CONTRAST  Result Date: 07/16/2020 CLINICAL DATA:  Transient ischemic attack. EXAM: MRI HEAD WITHOUT CONTRAST MRA HEAD WITHOUT CONTRAST TECHNIQUE: Multiplanar, multiecho pulse sequences of the brain and surrounding structures were obtained without intravenous contrast. Angiographic images of the head were obtained using MRA technique without contrast. COMPARISON:  Head CT July 16, 2020. FINDINGS: MRI HEAD FINDINGS Brain: No acute infarction, hemorrhage, hydrocephalus, extra-axial collection or mass lesion. Scattered foci of T2 hyperintensity are seen within the white matter of the cerebral hemispheres, nonspecific. Focus of susceptibility artifact in the right  middle cerebellar peduncle, likely from prior microhemorrhage. Vascular: Normal flow voids. Skull and upper cervical spine: Normal marrow signal. Sinuses/Orbits: Paranasal sinuses are essentially clear. Bilateral lens surgery. MRA HEAD FINDINGS The visualized portions of the distal cervical and intracranial internal carotid arteries are widely patent with normal flow related enhancement. The bilateral anterior cerebral arteries and middle cerebral arteries are widely patent with antegrade flow without high-grade flow-limiting stenosis or proximal branch occlusion. No intracranial aneurysm within the anterior circulation. The vertebral arteries are widely patent with antegrade flow. Vertebrobasilar junction and basilar artery are widely patent with antegrade flow without evidence of basilar stenosis or aneurysm. Posterior cerebral arteries are normal bilaterally. No intracranial aneurysm within  the posterior circulation. IMPRESSION: 1. No acute intracranial abnormality. 2. Scattered foci of T2 hyperintensity within the white matter of the cerebral hemispheres, nonspecific, but may be seen in the setting of chronic small vessel ischemic disease. 3. Unremarkable MRA of the head. Electronically Signed   By: Pedro Earls M.D.   On: 07/16/2020 12:38   MR BRAIN WO CONTRAST  Result Date: 07/16/2020 CLINICAL DATA:  Transient ischemic attack. EXAM: MRI HEAD WITHOUT CONTRAST MRA HEAD WITHOUT CONTRAST TECHNIQUE: Multiplanar, multiecho pulse sequences of the brain and surrounding structures were obtained without intravenous contrast. Angiographic images of the head were obtained using MRA technique without contrast. COMPARISON:  Head CT July 16, 2020. FINDINGS: MRI HEAD FINDINGS Brain: No acute infarction, hemorrhage, hydrocephalus, extra-axial collection or mass lesion. Scattered foci of T2 hyperintensity are seen within the white matter of the cerebral hemispheres, nonspecific. Focus of susceptibility  artifact in the right middle cerebellar peduncle, likely from prior microhemorrhage. Vascular: Normal flow voids. Skull and upper cervical spine: Normal marrow signal. Sinuses/Orbits: Paranasal sinuses are essentially clear. Bilateral lens surgery. MRA HEAD FINDINGS The visualized portions of the distal cervical and intracranial internal carotid arteries are widely patent with normal flow related enhancement. The bilateral anterior cerebral arteries and middle cerebral arteries are widely patent with antegrade flow without high-grade flow-limiting stenosis or proximal branch occlusion. No intracranial aneurysm within the anterior circulation. The vertebral arteries are widely patent with antegrade flow. Vertebrobasilar junction and basilar artery are widely patent with antegrade flow without evidence of basilar stenosis or aneurysm. Posterior cerebral arteries are normal bilaterally. No intracranial aneurysm within the posterior circulation. IMPRESSION: 1. No acute intracranial abnormality. 2. Scattered foci of T2 hyperintensity within the white matter of the cerebral hemispheres, nonspecific, but may be seen in the setting of chronic small vessel ischemic disease. 3. Unremarkable MRA of the head. Electronically Signed   By: Pedro Earls M.D.   On: 07/16/2020 12:38   ECHOCARDIOGRAM COMPLETE  Result Date: 07/16/2020    ECHOCARDIOGRAM REPORT   Patient Name:   JOHM PFANNENSTIEL Bosshart Date of Exam: 07/16/2020 Medical Rec #:  [id number]        Height: Accession #:    [id number]       Weight: Date of Birth:  19-May-1932         BSA: Patient Age:    60 years         BP:           147/102 mmHg Patient Gender: M                HR:           87 bpm. Exam Location:  Inpatient Procedure: 2D Echo, Cardiac Doppler and Color Doppler Indications:    Stroke I63.9  History:        Patient has no prior history of Echocardiogram examinations.                 Risk Factors:Diabetes.  Sonographer:    Darlina Sicilian RDCS Referring  Phys: [place]  1. Left ventricular ejection fraction, by estimation, is 60 to 65%. The left ventricle has normal function. The left ventricle has no regional wall motion abnormalities. There is moderate eccentric left ventricular hypertrophy of the basal-septal segment. Left ventricular diastolic parameters are consistent with Grade I diastolic dysfunction (impaired relaxation).  2. Right ventricular systolic function is normal. The right ventricular size is normal.  3. The mitral valve is normal  in structure. Trivial mitral valve regurgitation.  4. The aortic valve is tricuspid. There is mild calcification of the aortic valve. There is mild thickening of the aortic valve. Aortic valve regurgitation is trivial. Mild aortic valve sclerosis is present, with no evidence of aortic valve stenosis.  5. Aortic dilatation noted. There is borderline dilatation of the aortic root, measuring 36 mm. There is mild dilatation of the ascending aorta, measuring 40 mm. Conclusion(s)/Recommendation(s): No intracardiac source of embolism detected on this transthoracic study. A transesophageal echocardiogram is recommended to exclude cardiac source of embolism if clinically indicated. FINDINGS  Left Ventricle: Left ventricular ejection fraction, by estimation, is 60 to 65%. The left ventricle has normal function. The left ventricle has no regional wall motion abnormalities. The left ventricular internal cavity size was normal in size. There is  moderate eccentric left ventricular hypertrophy of the basal-septal segment. Left ventricular diastolic parameters are consistent with Grade I diastolic dysfunction (impaired relaxation). Right Ventricle: The right ventricular size is normal. No increase in right ventricular wall thickness. Right ventricular systolic function is normal. Left Atrium: Left atrial size was normal in size. Right Atrium: Right atrial size was normal in size. Pericardium: There is no evidence  of pericardial effusion. Mitral Valve: The mitral valve is normal in structure. There is mild thickening of the mitral valve leaflet(s). Mild mitral annular calcification. Trivial mitral valve regurgitation. Tricuspid Valve: The tricuspid valve is normal in structure. Tricuspid valve regurgitation is not demonstrated. Aortic Valve: The aortic valve is tricuspid. There is mild calcification of the aortic valve. There is mild thickening of the aortic valve. Aortic valve regurgitation is trivial. Mild aortic valve sclerosis is present, with no evidence of aortic valve stenosis. Pulmonic Valve: The pulmonic valve was normal in structure. Pulmonic valve regurgitation is not visualized. Aorta: Aortic dilatation noted. There is borderline dilatation of the aortic root, measuring 36 mm. There is mild dilatation of the ascending aorta, measuring 40 mm. IAS/Shunts: The interatrial septum was not well visualized. No shunting seen based on limited views.  LEFT VENTRICLE PLAX 2D LVIDd:         3.50 cm  Diastology LVIDs:         2.40 cm  LV e' medial:    3.92 cm/s LV PW:         0.95 cm  LV E/e' medial:  19.8 LV IVS:        1.55 cm  LV e' lateral:   5.98 cm/s LVOT diam:     1.90 cm  LV E/e' lateral: 13.0 LV SV:         39 LVOT Area:     2.84 cm  RIGHT VENTRICLE RV S prime:     11.60 cm/s TAPSE (M-mode): 1.6 cm LEFT ATRIUM LA Vol (A2C):   18.2 ml LA Vol (A4C):   46.2 ml LA Biplane Vol: 29.8 ml  AORTIC VALVE LVOT Vmax:   80.90 cm/s LVOT Vmean:  52.000 cm/s LVOT VTI:    0.137 m  AORTA Ao Root diam: 3.60 cm Ao Asc diam:  3.90 cm MITRAL VALVE MV Area (PHT): 3.81 cm     SHUNTS MV Decel Time: 199 msec     Systemic VTI:  0.14 m MV E velocity: 77.60 cm/s   Systemic Diam: 1.90 cm MV A velocity: 103.00 cm/s MV E/A ratio:  0.75 Gwyndolyn Kaufman MD Electronically signed by Gwyndolyn Kaufman MD Signature Date/Time: 07/16/2020/1:17:17 PM    Final    VAS US CAROTID (at [facility] and  [facility] only)  Result Date: 07/16/2020 Carotid Arterial Duplex Study  Indications:       TIA. Risk Factors:      Hyperlipidemia, Diabetes. Limitations        Today's exam was limited due to the high bifurcation of the                    carotid. Comparison Study:  No prior studies. Performing Technologist: Darlin Coco RDMS, RVT  Examination Guidelines: A complete evaluation includes B-mode imaging, spectral Doppler, color Doppler, and power Doppler as needed of all accessible portions of each vessel. Bilateral testing is considered an integral part of a complete examination. Limited examinations for reoccurring indications may be performed as noted.  Right Carotid Findings: +----------+--------+--------+--------+------------------+--------+           PSV cm/sEDV cm/sStenosisPlaque DescriptionComments +----------+--------+--------+--------+------------------+--------+ CCA Prox  78      21                                         +----------+--------+--------+--------+------------------+--------+ CCA Distal55      15              calcific                   +----------+--------+--------+--------+------------------+--------+ ICA Prox  44      16      1-39%   calcific                   +----------+--------+--------+--------+------------------+--------+ ICA Distal71      21                                         +----------+--------+--------+--------+------------------+--------+ ECA       49                                                 +----------+--------+--------+--------+------------------+--------+ +----------+--------+-------+----------------+-------------------+           PSV cm/sEDV cmsDescribe        Arm Pressure (mmHG) +----------+--------+-------+----------------+-------------------+ [id number]             Multiphasic, WNL                    +----------+--------+-------+----------------+-------------------+ +---------+--------+--+--------+--+---------+ VertebralPSV cm/s43EDV cm/s10Antegrade  +---------+--------+--+--------+--+---------+  Left Carotid Findings: +----------+--------+--------+--------+------------------+--------+           PSV cm/sEDV cm/sStenosisPlaque DescriptionComments +----------+--------+--------+--------+------------------+--------+ CCA Prox  73      19                                         +----------+--------+--------+--------+------------------+--------+ CCA Distal57      18                                         +----------+--------+--------+--------+------------------+--------+ ICA Prox  67      16      1-39%   hyperechoic                +----------+--------+--------+--------+------------------+--------+  ICA Distal67      24                                         +----------+--------+--------+--------+------------------+--------+ ECA       57                                                 +----------+--------+--------+--------+------------------+--------+ +----------+--------+--------+----------------+-------------------+           PSV cm/sEDV cm/sDescribe        Arm Pressure (mmHG) +----------+--------+--------+----------------+-------------------+ [id number]              Multiphasic, WNL                    +----------+--------+--------+----------------+-------------------+ +---------+--------+--+--------+--+---------+ VertebralPSV cm/s47EDV cm/s13Antegrade +---------+--------+--+--------+--+---------+   Summary: Right Carotid: Velocities in the right ICA are consistent with a 1-39% stenosis. Left Carotid: Velocities in the left ICA are consistent with a 1-39% stenosis. Vertebrals:  Bilateral vertebral arteries demonstrate antegrade flow. Subclavians: Normal flow hemodynamics were seen in bilateral subclavian              arteries. *See table(s) above for measurements and observations.  Electronically signed by Antony Contras MD on 07/16/2020 at 1:43:47 PM.    Final    Time spent: 35 minutes in 2 separate  visits, more than 50% at bedside in counseling/discussions  Marzetta Board, MD, PhD [facility]  Between 7 am - 7 pm I am available, please contact me via Amion or Securechat  Between 7 pm - 7 am I am not available, please contact night coverage MD/APP via Amion

## 2020-07-17 NOTE — Evaluation (Signed)
Physical Therapy Evaluation Patient Details Name: Albert Rogers MRN: [id number] DOB: Dec 25, 1932 Today's Date: 07/17/2020   History of Present Illness  87 y/o male presented to ED on 3/3 with chief complaint of confusion (last normal 2/28) and fall. No acute findings on head CT or MRI. PMH: DM, prostate cancer, macular degeneration.  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 Patient requires maxA+2 for bed mobility and mod-maxA+2 for sit to stands and transfers with RW. Patient able to take a few steps forward with RW and modA+2, difficulty weight shifting to advance LEs. Patient presents with generalized weakness, impaired balance, decreased activity tolerance, impaired cognition. Patient will benefit from skilled PT services during acute stay to address listed deficits. Recommend SNF following discharge to maximize functional mobility and return to PLOF.      Follow Up Recommendations SNF    Equipment Recommendations  Rolling Bertine Schlottman with 5" wheels;3in1 (PT)    Recommendations for Other Services       Precautions / Restrictions Precautions Precautions: Fall Restrictions Weight Bearing Restrictions: No      Mobility  Bed Mobility Overal bed mobility: Needs Assistance Bed Mobility: Supine to Sit     Supine to sit: Max assist;+2 for physical assistance;+2 for safety/equipment     General bed mobility comments: Required assistance for LEs, trunk lift and negotiation and use of bed pad to scoot edge of bed. Initially reported being very dizzy on sitting up and exhibited some difficulty motor planning LEs.    Transfers Overall transfer level: Needs assistance Equipment used: Rolling Sanai Frick (2 wheeled) Transfers: Sit to/from [facility];Lateral/Scoot Transfers Sit to Stand: Mod assist;+2 physical assistance;+2 safety/equipment;From elevated  surface;Max assist Stand pivot transfers: Mod assist;+2 physical assistance;+2 safety/equipment      Lateral/Scoot Transfers: Mod assist;+2 physical assistance;+2 safety/equipment General transfer comment: Multiple sit to stands performed with assistance of two therapist. Initially feet sliding forward and patient exhibiting difficulty keeping feet on floor. First stand max x 2 to block feet, power up, maintain balance and hold onto Donoven Pett. Patient fearful with stand and thinking he was going to fall. Ultimately returned to seated position.Attempted stand pivot wihtout Lillard Bailon to chair and unable to get patient into full standing as his legs kicked out again. Performed lateral scooting to his right into chair with mod x 2. Attempted stand from recliner and patient assisted with UEs and then able to take steps forward a couple of feet with Braylyn Kalter +2 assistance. Patient reports both legs felt weak.  Ambulation/Gait Ambulation/Gait assistance: Mod assist;+2 physical assistance;+2 safety/equipment Gait Distance (Feet): 4 Feet Assistive device: Rolling Maniya Donovan (2 wheeled) Gait Pattern/deviations: Step-to pattern;Decreased stride length;Decreased weight shift to right;Decreased weight shift to left;Wide base of support Gait velocity: decreased   General Gait Details: Patient able to take steps fwd with chair follow. Patient with difficulty with weight shifting to advance B LEs. Fearful of falling which limited progression. Assist for balance, RW management, and weight shifting  Stairs            Wheelchair Mobility    Modified Rankin (Stroke Patients Only)       Balance Overall balance assessment: Needs assistance Sitting-balance support: No upper extremity supported;Feet supported Sitting balance-Leahy Scale: Fair Sitting balance - Comments: min guard for safety   Standing balance support: Bilateral upper extremity supported;During functional activity Standing balance-Leahy Scale:  Poor Standing balance comment: reliant on external assist and  UE support                             Pertinent Vitals/Pain Pain Assessment: Faces Faces Pain Scale: Hurts a little bit Pain Location: Left lower leg Pain Descriptors / Indicators: Grimacing Pain Intervention(s): Monitored during session    Home Living Family/patient expects to be discharged to:: Skilled nursing facility Living Arrangements: Spouse/significant other Available Help at Discharge: Family           Home Equipment: Gilford Rile - 2 wheels;Cane - single point      Prior Function Level of Independence: Independent with assistive device(s)   Gait / Transfers Assistance Needed: independent with cane. uses buggy at the grocery store.           Hand Dominance   Dominant Hand: Right    Extremity/Trunk Assessment   Upper Extremity Assessment Upper Extremity Assessment: Defer to OT evaluation     Lower Extremity Assessment Lower Extremity Assessment: Generalized weakness (L weakness > R LE - functionally >3/5)    Cervical / Trunk Assessment Cervical / Trunk Assessment: Normal  Communication   Communication: HOH  Cognition Arousal/Alertness: Awake/alert Behavior During Therapy: WFL for tasks assessed/performed Overall Cognitive Status: Difficult to assess                                 General Comments: Difficult to assess due to [employer] Alert to self, place and situation. Difficulty processing and following multi-step commands      General Comments      Exercises     Assessment/Plan    PT Assessment Patient needs continued PT services  PT Problem List Decreased strength;Decreased activity tolerance;Decreased balance;Decreased mobility;Decreased coordination;Decreased safety awareness       PT Treatment Interventions DME instruction;Gait training;Stair training;Functional mobility training;Therapeutic activities;Therapeutic exercise;Balance training;Patient/family  education    PT Goals (Current goals can be found in the Care Plan section)  Acute Rehab PT Goals Patient Stated Goal: "I need some therapy" PT Goal Formulation: With patient Time For Goal Achievement: 07/31/20 Potential to Achieve Goals: Good    Frequency Min 2X/week   Barriers to discharge        Co-evaluation PT/OT/SLP Co-Evaluation/Treatment: Yes Reason for Co-Treatment: For patient/therapist safety;To address functional/ADL transfers PT goals addressed during session: Balance;Mobility/safety with mobility;Proper use of DME OT goals addressed during session: ADL's and self-care       AM-PAC PT "6 Clicks" Mobility  Outcome Measure Help needed turning from your back to your side while in a flat bed without using bedrails?: A Lot Help needed moving from lying on your back to sitting on the side of a flat bed without using bedrails?: A Lot Help needed moving to and from a bed to a chair (including a wheelchair)?: A Lot Help needed standing up from a chair using your arms (e.g., wheelchair or bedside chair)?: A Lot Help needed to walk in hospital room?: A Lot Help needed climbing 3-5 steps with a railing? : Total 6 Click Score: 11    End of Session Equipment Utilized During Treatment: Gait belt Activity Tolerance: Patient tolerated treatment well Patient left: in chair;with call bell/phone within reach;with chair alarm set;with family/visitor present Nurse Communication: Mobility status PT Visit Diagnosis: Unsteadiness on feet (R26.81);Muscle weakness (generalized) (M62.81);History of falling (Z91.81);Difficulty in walking, not elsewhere classified (R26.2)    Time: 6295-2841 PT Time Calculation (min) (ACUTE ONLY): 37  min   Charges:   PT Evaluation $PT Eval Moderate Complexity: 1 Mod PT Treatments $Therapeutic Activity: 8-22 mins        Nels Munn A. Gilford Rile PT, DPT Acute Rehabilitation Services Pager [phone] Office [phone]   Linna Hoff 07/17/2020,  1:11 PM

## 2020-07-18 DIAGNOSIS — G9341 Metabolic encephalopathy: Secondary | ICD-10-CM | POA: Diagnosis not present

## 2020-07-18 LAB — CBC
HCT: 37.8 % — ABNORMAL LOW (ref 39.0–52.0)
Hemoglobin: 12.9 g/dL — ABNORMAL LOW (ref 13.0–17.0)
MCH: 29.2 pg (ref 26.0–34.0)
MCHC: 34.1 g/dL (ref 30.0–36.0)
MCV: 85.5 fL (ref 80.0–100.0)
Platelets: 120 10*3/uL — ABNORMAL LOW (ref 150–400)
RBC: 4.42 MIL/uL (ref 4.22–5.81)
RDW: 14.8 % (ref 11.5–15.5)
WBC: 4.4 10*3/uL (ref 4.0–10.5)
nRBC: 0 % (ref 0.0–0.2)

## 2020-07-18 LAB — GLUCOSE, CAPILLARY
Glucose-Capillary: 156 mg/dL — ABNORMAL HIGH (ref 70–99)
Glucose-Capillary: 161 mg/dL — ABNORMAL HIGH (ref 70–99)
Glucose-Capillary: 128 mg/dL — ABNORMAL HIGH (ref 70–99)
Glucose-Capillary: 163 mg/dL — ABNORMAL HIGH (ref 70–99)

## 2020-07-18 LAB — COMPREHENSIVE METABOLIC PANEL
ALT: 20 U/L (ref 0–44)
AST: 22 U/L (ref 15–41)
Albumin: 3.3 g/dL — ABNORMAL LOW (ref 3.5–5.0)
Alkaline Phosphatase: 63 U/L (ref 38–126)
Anion gap: 10 (ref 5–15)
BUN: 13 mg/dL (ref 8–23)
CO2: 23 mmol/L (ref 22–32)
Calcium: 9.1 mg/dL (ref 8.9–10.3)
Chloride: 99 mmol/L (ref 98–111)
Creatinine, Ser: 0.64 mg/dL (ref 0.61–1.24)
GFR, Estimated: >60 mL/min (ref >60)
Glucose, Bld: 172 mg/dL — ABNORMAL HIGH (ref 70–99)
Potassium: 4.2 mmol/L (ref 3.5–5.1)
Sodium: 132 mmol/L — ABNORMAL LOW (ref 135–145)
Total Bilirubin: 1 mg/dL (ref 0.3–1.2)
Total Protein: 6.3 g/dL — ABNORMAL LOW (ref 6.5–8.1)

## 2020-07-18 LAB — MAGNESIUM: Magnesium: 1.9 mg/dL (ref 1.7–2.4)

## 2020-07-18 LAB — SARS CORONAVIRUS 2 (TAT 6-24 HRS): SARS Coronavirus 2: NEGATIVE

## 2020-07-18 LAB — PHOSPHORUS: Phosphorus: 3.8 mg/dL (ref 2.5–4.6)

## 2020-07-18 MED ORDER — CYANOCOBALAMIN 1000 MCG/ML IJ SOLN
1000.0000 ug | Freq: Once | INTRAMUSCULAR | Status: AC
  Administered 2020-07-18: 1000 ug via INTRAMUSCULAR
  Filled 2020-07-18: qty 1

## 2020-07-18 MED ORDER — AMLODIPINE BESYLATE 5 MG PO TABS
5.0000 mg | ORAL_TABLET | Freq: Every day | ORAL | Status: DC
  Administered 2020-07-19: 5 mg via ORAL
  Filled 2020-07-18: qty 1

## 2020-07-18 NOTE — Progress Notes (Signed)
PROGRESS NOTE  Albert Rogers CWU:[id number] DOB: 09/13/1932 DOA: 07/16/2020 PCP: Alroy Dust, L.Marlou Sa, MD   LOS: 1 day   Brief Narrative / Interim history: 87 year old male with history of DM, prostate cancer, macular degeneration presenting to the hospital with slightly more confusion and poor balance.  Daughter tells me that on Monday he has been complaining of back pain.  He has a history of leg pain and he has been complaining of that for several years but the back pain appears to be new.  Following that, he kept saying that he was just not feeling good, unable to elaborate.  Family observed him to have significantly worsening balance and appeared to be confused and disoriented in space and not knowing where to head.  While normally he uses a cane he was shuffling, stumbling a lot, holding onto the walls.  There is no noted focal weakness or speech slurring.  Daughter reports a chronic cough especially after he is eating, but denies any fever or chills.  Subjective / 24h Interval events: No complaints, no back pain, no leg pain.  Does complain of occasional foot numbness/neuropathy which she failed to mention before  Assessment & Plan: Principal Problem Generalized weakness, subacute metabolic encephalopathy -not entirely clear as to his etiology.  He does not have any obvious focal weakness and MRI of the brain was negative.  I wonder whether he has mild underlying dementia that is barely noticeable by family.  PT evaluated patient this morning and based on my bedside discussion with the physical therapist recommending SNF since he requires 2 person assist and there is no help at home.  He is definitely weaker than his baseline currently.  CK, TSH unremarkable.  Orthostatic vital signs negative.  B12 although within normal limits is on the lower side, it would be reasonable to replete IM, given a dose this morning.  That can explain his numbness/neuropathy of his feet  Active Problems Back  pain-x-ray with degenerative changes, nothing acute.  No pain today  Hyperlipidemia-continue home statin  Possible aspiration pneumonia-no significant clear-cut pneumonia type symptoms however it may be early.  Chest x-ray raise concern for infiltrate, started on azithromycin for atypical infection or Augmentin for aspiration.  No respiratory symptoms, plan for total of 3 days of treatment  Type 2 diabetes mellitus-A1c 6.8, no hypoglycemic events  CBG (last 3)  Recent Labs    07/17/20 1150 07/17/20 1557 07/18/20 0730  GLUCAP 121* 149* 156*    Macular degeneration -noted  Prostate cancer -noted   Scheduled Meds: .  stroke: mapping our early stages of recovery book   Does not apply Once  . amoxicillin-clavulanate  1 tablet Oral Q12H  . aspirin  300 mg Rectal Daily   Or  . aspirin  325 mg Oral Daily  . cyanocobalamin  1,000 mcg Intramuscular Once  . enoxaparin (LOVENOX) injection  40 mg Subcutaneous Q24H  . feeding supplement (NEPRO CARB STEADY)  237 mL Oral BID BM  . LORazepam  1 mg Intravenous Once  . multivitamin with minerals  1 tablet Oral Daily  . rosuvastatin  10 mg Oral Daily   Continuous Infusions: . sodium chloride 50 mL/hr at 07/18/20 0328   PRN Meds:.acetaminophen **OR** acetaminophen (TYLENOL) oral liquid 160 mg/5 mL **OR** acetaminophen, melatonin, senna-docusate  Diet Orders (From admission, onward)    Start     Ordered   07/16/20 1017  Diet heart healthy/carb modified Room service appropriate? Yes; Fluid consistency: Thin  Diet effective ____  Comments: Effective once patient passes swallow evaluation  Question Answer Comment  Diet-HS Snack? Nothing   Room service appropriate? Yes   Fluid consistency: Thin      07/16/20 1019          DVT prophylaxis: enoxaparin (LOVENOX) injection 40 mg Start: 07/16/20 1100     Code Status: DNR  Family Communication: daughter at bedside   Status is: Inpatient  Remains inpatient appropriate  because:Unsafe d/c plan   Dispo: The patient is from: Home              Anticipated d/c is to: SNF              Patient currently is medically stable to d/c.   Difficult to place patient No   Level of care: Telemetry Medical  Consultants:  None   Procedures:  2D echo  Microbiology  None   Antimicrobials: Azithromycin / Augmentin    Objective: Vitals:   07/17/20 1945 07/17/20 2340 07/18/20 0353 07/18/20 0700  BP: (!) 159/101 (!) 160/98 (!) 167/100 (!) 158/91  Pulse: (!) 102 90 87 (!) 102  Resp: 15 17 18  (!) 22  Temp: 98.1 F (36.7 C) 97.8 F (36.6 C) 98.3 F (36.8 C) 98.4 F (36.9 C)  TempSrc: Oral Oral Oral Oral  SpO2: 92% 95% 95%   Weight:      Height:        Intake/Output Summary (Last 24 hours) at 07/18/2020 1040 Last data filed at 07/18/2020 0530 Gross per 24 hour  Intake 966.58 ml  Output 451 ml  Net 515.58 ml   Filed Weights   07/16/20 1949  Weight: 83.1 kg    Examination:  Constitutional: NAD Eyes: no icterus  ENMT: mmm Neck: normal, supple Respiratory: CTA biL, no wheezing, no crackles Cardiovascular: Regular rate and rhythm, no murmurs, no edema Abdomen: soft, NT, ND, bowel sounds positive Musculoskeletal: no clubbing / cyanosis.  Skin: No rashes seen Neurologic: No focal deficits  Data Reviewed: I have independently reviewed following labs and imaging studies   CBC: Recent Labs  Lab 07/16/20 0357 07/18/20 0230  WBC 5.8 4.4  NEUTROABS 4.4  --   HGB 13.0 12.9*  HCT 41.0 37.8*  MCV 88.7 85.5  PLT 130* 951*   Basic Metabolic Panel: Recent Labs  Lab 07/16/20 0357 07/18/20 0230  NA 137 132*  K 3.9 4.2  CL 101 99  CO2 25 23  GLUCOSE 143* 172*  BUN 18 13  CREATININE 0.88 0.64  CALCIUM 9.6 9.1  MG  --  1.9  PHOS  --  3.8   Liver Function Tests: Recent Labs  Lab 07/16/20 0357 07/18/20 0230  AST 21 22  ALT 18 20  ALKPHOS 68 63  BILITOT 0.9 1.0  PROT 6.6 6.3*  ALBUMIN 3.6 3.3*   Coagulation Profile: No results for  input(s): INR, PROTIME in the last 168 hours. HbA1C: Recent Labs    07/17/20 0424  HGBA1C 6.8*   CBG: Recent Labs  Lab 07/17/20 1150 07/17/20 1557 07/18/20 0730  GLUCAP 121* 149* 156*    Recent Results (from the past 240 hour(s))  Resp Panel by RT-PCR (Flu A&B, Covid) Nasopharyngeal Swab     Status: None   Collection Time: 07/16/20  4:00 AM   Specimen: Nasopharyngeal Swab; Nasopharyngeal(NP) swabs in vial transport medium  Result Value Ref Range Status   SARS Coronavirus 2 by RT PCR NEGATIVE NEGATIVE Final    Comment: (NOTE) SARS-CoV-2 target nucleic acids are NOT DETECTED.  The SARS-CoV-2 RNA is generally detectable in upper respiratory specimens during the acute phase of infection. The lowest concentration of SARS-CoV-2 viral copies this assay can detect is 138 copies/mL. A negative result does not preclude SARS-Cov-2 infection and should not be used as the sole basis for treatment or other patient management decisions. A negative result may occur with  improper specimen collection/handling, submission of specimen other than nasopharyngeal swab, presence of viral mutation(s) within the areas targeted by this assay, and inadequate number of viral copies(<138 copies/mL). A negative result must be combined with clinical observations, patient history, and epidemiological information. The expected result is Negative.  Fact Sheet for Patients:  EntrepreneurPulse.com.au  Fact Sheet for Healthcare Providers:  IncredibleEmployment.be  This test is no t yet approved or cleared by the [place] FDA and  has been authorized for detection and/or diagnosis of SARS-CoV-2 by FDA under an Emergency Use Authorization (EUA). This EUA will remain  in effect (meaning this test can be used) for the duration of the COVID-19 declaration under Section 564(b)(1) of the Act, 21 U.S.C.section 360bbb-3(b)(1), unless the authorization is terminated  or  revoked sooner.       Influenza A by PCR NEGATIVE NEGATIVE Final   Influenza B by PCR NEGATIVE NEGATIVE Final    Comment: (NOTE) The Xpert Xpress SARS-CoV-2/FLU/RSV plus assay is intended as an aid in the diagnosis of influenza from Nasopharyngeal swab specimens and should not be used as a sole basis for treatment. Nasal washings and aspirates are unacceptable for Xpert Xpress SARS-CoV-2/FLU/RSV testing.  Fact Sheet for Patients: EntrepreneurPulse.com.au  Fact Sheet for Healthcare Providers: IncredibleEmployment.be  This test is not yet approved or cleared by the [place] FDA and has been authorized for detection and/or diagnosis of SARS-CoV-2 by FDA under an Emergency Use Authorization (EUA). This EUA will remain in effect (meaning this test can be used) for the duration of the COVID-19 declaration under Section 564(b)(1) of the Act, 21 U.S.C. section 360bbb-3(b)(1), unless the authorization is terminated or revoked.  Performed at [facility], [place] [address], [place], [place] [address]   Urine culture     Status: Abnormal   Collection Time: 07/16/20  5:18 AM   Specimen: Urine, Random  Result Value Ref Range Status   Specimen Description URINE, RANDOM  Final   Special Requests NONE  Final   Culture (A)  Final    <10,000 COLONIES/mL INSIGNIFICANT GROWTH Performed at [facility], [place] [address], [place], [place] [address]    Report Status 07/17/2020 FINAL  Final     Radiology Studies: DG Lumbar Spine 2-3 Views  Result Date: 07/17/2020 CLINICAL DATA:  Back pain EXAM: LUMBAR SPINE - 2-3 VIEW COMPARISON:  12/23/2016 FINDINGS: Degenerative disc disease and facet disease diffusely, most pronounced in the lower lumbar spine. Normal alignment. No fracture. SI joints symmetric and unremarkable. Aortic calcifications. No aneurysm. IMPRESSION: Degenerative disc and facet disease most pronounced in the lower lumbar spine. No acute  bony abnormality. Aortic atherosclerosis. Electronically Signed   By: Rolm Baptise M.D.   On: 07/17/2020 11:19   Time spent: 35 minutes in 2 separate visits, more than 50% at bedside in counseling/discussions  Marzetta Board, MD, PhD [facility]  Between 7 am - 7 pm I am available, please contact me via Amion or Securechat  Between 7 pm - 7 am I am not available, please contact night coverage MD/APP via Amion

## 2020-07-18 NOTE — Plan of Care (Signed)

## 2020-07-18 NOTE — TOC Progression Note (Signed)
Transition of Care [facility]) - Progression Note    Patient Details  Name: Albert Rogers MRN: [id number] Date of Birth: Dec 18, 1932  Transition of Care [facility]) CM/SW [place], [place] Phone Number: 07/18/2020, 3:24 PM  Clinical Narrative:    CSW met with pt and dtr at bedside. They have chosen [facility]. CSW updated facility and they noted they could take pt tomorrow, and will need a new covid test. MD and family updated, covid ordered. SW will follow for DC planning.   Expected Discharge Plan: [place] Barriers to Discharge: Continued Medical Work up  Expected Discharge Plan and Services Expected Discharge Plan: [place] Choice: [place] arrangements for the past 2 months: Single Family Home                                       Social Determinants of Health (SDOH) Interventions    Readmission Risk Interventions No flowsheet data found.

## 2020-07-18 NOTE — Evaluation (Signed)
Speech Language Pathology Evaluation Patient Details Name: Albert Rogers MRN: [id number] DOB: 1932/12/05 Today's Date: 07/18/2020 Time: 4098-1191 SLP Time Calculation (min) (ACUTE ONLY): 20 min  Problem List:  Patient Active Problem List   Diagnosis Date Noted  . AMS (altered mental status) 07/17/2020  . Acute metabolic encephalopathy 47/82/9562  . Type 2 diabetes mellitus without complication ([place]) 13/12/6576  . Dyslipidemia 07/16/2020  . Exudative senile macular degeneration of retina ([place]) 07/16/2020   Past Medical History:  Past Medical History:  Diagnosis Date  . Diabetes ([place])   . Macular degeneration   . Prostate cancer [facility])    Past Surgical History: History reviewed. No pertinent surgical history. HPI:  Albert Rogers is a 87 y.o. male with medical history significant of DM; prostate CA; macular degeneration; presenting with fall, AMS (last known normal 2/28).   Past medical history is significant for DM, prostate cancer, macular degeneration presenting to the hospital with slightly more confusion and poor balance.  Daughter tells me that on Monday he has been complaining of back pain.  He has a history of leg pain and he has been complaining of that for several years but the back pain appears to be new.  Following that, he kept saying that he was just not feeling good, unable to elaborate.  Family observed him to have significantly worsening balance and appeared to be confused and disoriented in space and not knowing where to head.  While normally he uses a cane he was shuffling, stumbling a lot, holding onto the walls.  There is no noted focal weakness or speech slurring.  Daughter reports a chronic cough especially after he is eating, but denies any fever or chills.  MRI of the head was showing no acute intracranial abnormality.  Most recent chest xray was showing the following:  Subpleural reticulation on both sides of indeterminate chronicity.  Please correlate for atypical  pneumonia symptoms.   Assessment / Plan / Recommendation Clinical Impression  Cogntiive/linguistic evaluation and motor speech screen were completed.  Patient with significant hearing impairment that possibly impacted portions of this exam.  The patient's speech was clear and easy to understand.  No discernible dysarthria or apraxia were noted.  He achieved an overall score of 23 out of a possible 28 points on the [place] Exam (usually scored out of 30 - writing and copying portions were not administered).  He was fully oriented to person, place and situation.  He was partially oriented to time knowing the year and month.  He was unable to accurately state the day of the week or the date.  He had good immediate recall of three novel words.  Given a short delay he was able to recall 2/3 words independently.  Given a semantic cue he was able to recall third novel word.  He had good attention to task.  Language skills were intact.  His speech was fluent and he was able to easily describe reason for his admission.  He was also able to share stories from his past and tell jokes.  He was able to name objects, repeat a short phrase, follow 2 parts of a 3 step command and read/comprehend a sentence.  In addition, he was able to provide logical solutions to simple problems.  Given results of this evaluation ST follow up is not indicated during acute stay.  Suggest in depth cognitive evaluation at the next level of care to assess for readiness for independence at home managing a household  including bills and managing medication.  If we can be of further assistance please feel free to reconsult.    SLP Assessment  SLP Recommendation/Assessment: Patient does not need any further Speech [place] Pathology Services SLP Visit Diagnosis: Cognitive communication deficit (R41.841)    Follow Up Recommendations  Other (comment) (in depth cognitive assessment at the next level of care)          SLP  Evaluation Cognition  Overall Cognitive Status: Difficult to assess (But appeared to be functional.) Arousal/Alertness: Awake/alert Orientation Level: Oriented to person;Oriented to place;Oriented to situation;Disoriented to time Attention: Sustained Sustained Attention: Appears intact Memory: Appears intact Awareness: Appears intact Problem Solving: Appears intact       Comprehension  Auditory Comprehension Overall Auditory Comprehension: Appears within functional limits for tasks assessed Commands: Within Functional Limits Conversation: Simple Reading Comprehension Reading Status: Within funtional limits    Expression Verbal Expression Overall Verbal Expression: Appears within functional limits for tasks assessed Initiation: No impairment Automatic Speech: Name;Social Response Level of Generative/Spontaneous Verbalization: Sentence;Conversation Repetition: No impairment Naming: No impairment Pragmatics: No impairment Non-Verbal Means of Communication: Not applicable Written Expression Dominant Hand: Right Written Expression: Not tested   Oral / Motor  Motor Speech Overall Motor Speech: Appears within functional limits for tasks assessed Respiration: Within functional limits Phonation: Normal Resonance: Within functional limits Articulation: Within functional limitis Intelligibility: Intelligible Motor Planning: Witnin functional limits Motor Speech Errors: Not applicable   GO                   Albert Flatten, MA, CCC-SLP Acute Rehab SLP [phone]  Albert Rogers 07/18/2020, 2:53 PM

## 2020-07-19 DIAGNOSIS — E119 Type 2 diabetes mellitus without complications: Secondary | ICD-10-CM | POA: Diagnosis not present

## 2020-07-19 DIAGNOSIS — E1142 Type 2 diabetes mellitus with diabetic polyneuropathy: Secondary | ICD-10-CM | POA: Diagnosis not present

## 2020-07-19 DIAGNOSIS — M2550 Pain in unspecified joint: Secondary | ICD-10-CM | POA: Diagnosis not present

## 2020-07-19 DIAGNOSIS — R278 Other lack of coordination: Secondary | ICD-10-CM | POA: Diagnosis not present

## 2020-07-19 DIAGNOSIS — R41841 Cognitive communication deficit: Secondary | ICD-10-CM | POA: Diagnosis not present

## 2020-07-19 DIAGNOSIS — R4182 Altered mental status, unspecified: Secondary | ICD-10-CM | POA: Diagnosis not present

## 2020-07-19 DIAGNOSIS — W19XXXD Unspecified fall, subsequent encounter: Secondary | ICD-10-CM | POA: Diagnosis not present

## 2020-07-19 DIAGNOSIS — I10 Essential (primary) hypertension: Secondary | ICD-10-CM | POA: Diagnosis not present

## 2020-07-19 DIAGNOSIS — F0390 Unspecified dementia without behavioral disturbance: Secondary | ICD-10-CM | POA: Diagnosis not present

## 2020-07-19 DIAGNOSIS — C61 Malignant neoplasm of prostate: Secondary | ICD-10-CM | POA: Diagnosis not present

## 2020-07-19 DIAGNOSIS — G9341 Metabolic encephalopathy: Secondary | ICD-10-CM | POA: Diagnosis not present

## 2020-07-19 DIAGNOSIS — H353290 Exudative age-related macular degeneration, unspecified eye, stage unspecified: Secondary | ICD-10-CM | POA: Diagnosis not present

## 2020-07-19 DIAGNOSIS — H3530 Unspecified macular degeneration: Secondary | ICD-10-CM | POA: Diagnosis not present

## 2020-07-19 DIAGNOSIS — Z9181 History of falling: Secondary | ICD-10-CM | POA: Diagnosis not present

## 2020-07-19 DIAGNOSIS — E785 Hyperlipidemia, unspecified: Secondary | ICD-10-CM | POA: Diagnosis not present

## 2020-07-19 DIAGNOSIS — R404 Transient alteration of awareness: Secondary | ICD-10-CM | POA: Diagnosis not present

## 2020-07-19 DIAGNOSIS — M5459 Other low back pain: Secondary | ICD-10-CM | POA: Diagnosis not present

## 2020-07-19 DIAGNOSIS — M6281 Muscle weakness (generalized): Secondary | ICD-10-CM | POA: Diagnosis not present

## 2020-07-19 DIAGNOSIS — G9340 Encephalopathy, unspecified: Secondary | ICD-10-CM | POA: Diagnosis not present

## 2020-07-19 DIAGNOSIS — R2681 Unsteadiness on feet: Secondary | ICD-10-CM | POA: Diagnosis not present

## 2020-07-19 DIAGNOSIS — Z7401 Bed confinement status: Secondary | ICD-10-CM | POA: Diagnosis not present

## 2020-07-19 DIAGNOSIS — E538 Deficiency of other specified B group vitamins: Secondary | ICD-10-CM | POA: Diagnosis not present

## 2020-07-19 DIAGNOSIS — J690 Pneumonitis due to inhalation of food and vomit: Secondary | ICD-10-CM | POA: Diagnosis not present

## 2020-07-19 LAB — GLUCOSE, CAPILLARY: Glucose-Capillary: 154 mg/dL — ABNORMAL HIGH (ref 70–99)

## 2020-07-19 MED ORDER — CYANOCOBALAMIN 1000 MCG/ML IJ SOLN: 1000.0000 ug | INTRAMUSCULAR | 0 refills | Status: AC

## 2020-07-19 MED ORDER — AMLODIPINE BESYLATE 5 MG PO TABS: 5.0000 mg | ORAL_TABLET | Freq: Every day | ORAL | Status: DC

## 2020-07-19 MED ORDER — AMOXICILLIN-POT CLAVULANATE 875-125 MG PO TABS: 1.0000 | ORAL_TABLET | Freq: Two times a day (BID) | ORAL | 0 refills | Status: AC

## 2020-07-19 NOTE — TOC Transition Note (Signed)
Transition of Care [facility]) - CM/SW Discharge Note   Patient Details  Name: Albert Rogers MRN: [id number] Date of Birth: July 13, 1932  [facility] CM/SW Contact:  Coralee Pesa, [place] Phone Number: 07/19/2020, 10:21 AM   Clinical Narrative:    Pt discharging to [facility]. Nurse to call report to [phone]   Final next level of care: Skilled Nursing Facility Barriers to Discharge: Barriers Resolved   Patient Goals and CMS Choice Patient states their goals for this hospitalization and ongoing recovery are:: to get rehab to get back home CMS Medicare.gov Compare Post Acute Care list provided to:: Patient Choice offered to / list presented to : Patient  Discharge Placement              Patient chooses bed at: [place] Patient to be transferred to facility by: [place] Name of family member notified: Renee Patient and family notified of of transfer: 07/19/20  Discharge Plan and Services     Post Acute Care Choice: [place]                               Social Determinants of Health (SDOH) Interventions     Readmission Risk Interventions No flowsheet data found.

## 2020-07-19 NOTE — Progress Notes (Signed)
Patient report called and successfully given to Hinton Dyer, RN at receiving facility. Pt has been transported off the unit by [facility] enroute to the rehab facility. Discharge instructions were reviewed and left with the patient and family.

## 2020-07-19 NOTE — Discharge Summary (Signed)
Physician Discharge Summary  [name] FGH:[id number] DOB: 1933/03/26 DOA: 07/16/2020  PCP: Alroy Dust, L.Marlou Sa, MD  Admit date: 07/16/2020 Discharge date: 07/19/2020  Admitted From: home Disposition:  SNF  Recommendations for Outpatient Follow-up:  1. Follow up with PCP in 1-2 weeks 2. Please obtain BMP/CBC in one week  Home Health: none Equipment/Devices: none  Discharge Condition: stable CODE STATUS: DNR Diet recommendation: regular  HPI: Per admitting MD, Albert Rogers is a 87 y.o. male with medical history significant of DM; prostate CA; macular degeneration; presenting with fall, AMS (last known normal 2/28). Patient is alert and oriented x3. He is conversational but unable to answer complex questions appropriately- will describe unrelated events when attempting to elicit HPI. Able to follow directions on exam. Thorough history was obtained through phone call with his wife: Symptoms acutely started Monday.  He complained that his back was hurting and later around not feeling well most of the day.  Wife noticed that he was quiet and at baseline talks a lot.  He slept for most of the day and was only after maybe 2 hours during the day.  When he was up, he was shuffling and stumbling a lot and disoriented and confused.  He was unable to hold normal conversation but she did not notice any slurring in his speech. Last night he was standing in doorway holding onto wall and he was shaking all over and shouted "help me". Wife tried to hold him up but he ended up falling backwards as she tried to help him slowly go to the floor and hit his head on the floor. Did not lose consciousness.  He was not able to get back up even with wife's help for about 30 minutes. Couldn't straighten or move his legs.  Called the ambulance at that time.  Sometimes uses a cane when walking prolonged distances at stores but otherwise ambulates independently at home. Of note: His right eye is his "good" eye. Patient  wears a hearing aid.  Hospital Course / Discharge diagnoses: Principal Problem Generalized weakness, subacute metabolic encephalopathy, probable B12 deficiency-not entirely clear as to thee etiology. He does not have any obvious focal weakness and MRI of the brain was negative.  TSH, CK, orthostatic vital signs were all within normal parameters.  He was found to have B12 in the lower side of normal which can certainly explain some of his symptoms.  It was repleted IM, please continue iron repletion weekly for the next 3 weeks and then switch to oral and recheck a level.  Patient has also been describing some numbness/neuropathy of his feet makes it difficult to ambulate.  Active Problems Back pain-x-ray with degenerative changes, nothing acute.  Did not have significant back pain in the hospital Hyperlipidemia-continue home statin Possible aspiration pneumonia-no significant clear-cut pneumonia type symptoms however it may be early.  Chest x-ray raise concern for infiltrate, started on azithromycin for atypical infection or Augmentin for aspiration.  Received 3 days of azithromycin, continue 2 more days of Augmentin Type 2 diabetes mellitus-A1c 6.8, no hypoglycemic events. Resume home medications Macular degeneration -noted Prostate cancer -noted Essential hypertension-patient intermittently hypertensive while hospitalized, added low-dose amlodipine, continue to monitor BP in an outpatient setting  Sepsis ruled out   Discharge Instructions   Allergies as of 07/19/2020   No Known Allergies     Medication List    TAKE these medications   amLODipine 5 MG tablet Commonly known as: NORVASC Take 1 tablet (5 mg total) by  mouth daily. Start taking on: July 20, 2020   amoxicillin-clavulanate 875-125 MG tablet Commonly known as: AUGMENTIN Take 1 tablet by mouth every 12 (twelve) hours for 2 days.   cyanocobalamin 1000 MCG/ML injection Commonly known as: (VITAMIN B-12) Inject 1 mL (1,000  mcg total) into the muscle once a week for 3 doses.   metFORMIN 500 MG tablet Commonly known as: GLUCOPHAGE Take 500 mg by mouth 2 (two) times daily.   Ocuvite Adult 50+ Caps Take 1 capsule by mouth daily.   rosuvastatin 10 MG tablet Commonly known as: CRESTOR Take 10 mg by mouth daily.   VITAMIN D PO Take 1 capsule by mouth daily.       Contact information for after-discharge care    Destination    [facility] .   Service: Skilled Nursing Contact information: [place] [phone]                  Consultations:  None   Procedures/Studies:  DG Lumbar Spine 2-3 Views  Result Date: 07/17/2020 CLINICAL DATA:  Back pain EXAM: LUMBAR SPINE - 2-3 VIEW COMPARISON:  12/23/2016 FINDINGS: Degenerative disc disease and facet disease diffusely, most pronounced in the lower lumbar spine. Normal alignment. No fracture. SI joints symmetric and unremarkable. Aortic calcifications. No aneurysm. IMPRESSION: Degenerative disc and facet disease most pronounced in the lower lumbar spine. No acute bony abnormality. Aortic atherosclerosis. Electronically Signed   By: Rolm Baptise M.D.   On: 07/17/2020 11:19   CT Head Wo Contrast  Result Date: 07/16/2020 CLINICAL DATA:  Head trauma, minor EXAM: CT HEAD WITHOUT CONTRAST TECHNIQUE: Contiguous axial images were obtained from the base of the skull through the vertex without intravenous contrast. COMPARISON:  07/22/2004 brain MRI FINDINGS: Brain: No evidence of acute infarction, hemorrhage, hydrocephalus, extra-axial collection or mass lesion/mass effect. Cortical volume loss in keeping with age. Age congruent white matter low-density. Vascular: No hyperdense vessel or unexpected calcification. Skull: Normal. Negative for fracture or focal lesion. Sinuses/Orbits: No acute finding. Other: Case preliminarily reviewed during downtime, see epic. IMPRESSION: No evidence of  intracranial injury.  Age congruent head CT. Electronically Signed   By: Monte Fantasia M.D.   On: 07/16/2020 09:03   MR ANGIO HEAD WO CONTRAST  Result Date: 07/16/2020 CLINICAL DATA:  Transient ischemic attack. EXAM: MRI HEAD WITHOUT CONTRAST MRA HEAD WITHOUT CONTRAST TECHNIQUE: Multiplanar, multiecho pulse sequences of the brain and surrounding structures were obtained without intravenous contrast. Angiographic images of the head were obtained using MRA technique without contrast. COMPARISON:  Head CT July 16, 2020. FINDINGS: MRI HEAD FINDINGS Brain: No acute infarction, hemorrhage, hydrocephalus, extra-axial collection or mass lesion. Scattered foci of T2 hyperintensity are seen within the white matter of the cerebral hemispheres, nonspecific. Focus of susceptibility artifact in the right middle cerebellar peduncle, likely from prior microhemorrhage. Vascular: Normal flow voids. Skull and upper cervical spine: Normal marrow signal. Sinuses/Orbits: Paranasal sinuses are essentially clear. Bilateral lens surgery. MRA HEAD FINDINGS The visualized portions of the distal cervical and intracranial internal carotid arteries are widely patent with normal flow related enhancement. The bilateral anterior cerebral arteries and middle cerebral arteries are widely patent with antegrade flow without high-grade flow-limiting stenosis or proximal branch occlusion. No intracranial aneurysm within the anterior circulation. The vertebral arteries are widely patent with antegrade flow. Vertebrobasilar junction and basilar artery are widely patent with antegrade flow without evidence of basilar stenosis or aneurysm. Posterior cerebral arteries are normal bilaterally.  No intracranial aneurysm within the posterior circulation. IMPRESSION: 1. No acute intracranial abnormality. 2. Scattered foci of T2 hyperintensity within the white matter of the cerebral hemispheres, nonspecific, but may be seen in the setting of chronic small  vessel ischemic disease. 3. Unremarkable MRA of the head. Electronically Signed   By: Pedro Earls M.D.   On: 07/16/2020 12:38   MR BRAIN WO CONTRAST  Result Date: 07/16/2020 CLINICAL DATA:  Transient ischemic attack. EXAM: MRI HEAD WITHOUT CONTRAST MRA HEAD WITHOUT CONTRAST TECHNIQUE: Multiplanar, multiecho pulse sequences of the brain and surrounding structures were obtained without intravenous contrast. Angiographic images of the head were obtained using MRA technique without contrast. COMPARISON:  Head CT July 16, 2020. FINDINGS: MRI HEAD FINDINGS Brain: No acute infarction, hemorrhage, hydrocephalus, extra-axial collection or mass lesion. Scattered foci of T2 hyperintensity are seen within the white matter of the cerebral hemispheres, nonspecific. Focus of susceptibility artifact in the right middle cerebellar peduncle, likely from prior microhemorrhage. Vascular: Normal flow voids. Skull and upper cervical spine: Normal marrow signal. Sinuses/Orbits: Paranasal sinuses are essentially clear. Bilateral lens surgery. MRA HEAD FINDINGS The visualized portions of the distal cervical and intracranial internal carotid arteries are widely patent with normal flow related enhancement. The bilateral anterior cerebral arteries and middle cerebral arteries are widely patent with antegrade flow without high-grade flow-limiting stenosis or proximal branch occlusion. No intracranial aneurysm within the anterior circulation. The vertebral arteries are widely patent with antegrade flow. Vertebrobasilar junction and basilar artery are widely patent with antegrade flow without evidence of basilar stenosis or aneurysm. Posterior cerebral arteries are normal bilaterally. No intracranial aneurysm within the posterior circulation. IMPRESSION: 1. No acute intracranial abnormality. 2. Scattered foci of T2 hyperintensity within the white matter of the cerebral hemispheres, nonspecific, but may be seen in the setting  of chronic small vessel ischemic disease. 3. Unremarkable MRA of the head. Electronically Signed   By: Pedro Earls M.D.   On: 07/16/2020 12:38   DG Chest Portable 1 View  Result Date: 07/16/2020 CLINICAL DATA:  Cough EXAM: PORTABLE CHEST 1 VIEW COMPARISON:  12/23/2016 FINDINGS: Normal heart size and mediastinal contours. Reticular densities in the bilateral lower lungs with some subpleural predilection. No effusion or pneumothorax. Normal heart size and mediastinal contours. Artifact from EKG leads. IMPRESSION: Subpleural reticulation on both sides of indeterminate chronicity. Please correlate for atypical pneumonia symptoms. Electronically Signed   By: Monte Fantasia M.D.   On: 07/16/2020 04:28   ECHOCARDIOGRAM COMPLETE  Result Date: 07/16/2020    ECHOCARDIOGRAM REPORT   Patient Name:   SAHAND GOSCH Conrad Date of Exam: 07/16/2020 Medical Rec #:  [id number]        Height: Accession #:    [id number]       Weight: Date of Birth:  16-Dec-1932         BSA: Patient Age:    87 years         BP:           147/102 mmHg Patient Gender: M                HR:           87 bpm. Exam Location:  Inpatient Procedure: 2D Echo, Cardiac Doppler and Color Doppler Indications:    Stroke I63.9  History:        Patient has no prior history of Echocardiogram examinations.  Risk Factors:Diabetes.  Sonographer:    Darlina Sicilian RDCS Referring Phys: [place]  1. Left ventricular ejection fraction, by estimation, is 60 to 65%. The left ventricle has normal function. The left ventricle has no regional wall motion abnormalities. There is moderate eccentric left ventricular hypertrophy of the basal-septal segment. Left ventricular diastolic parameters are consistent with Grade I diastolic dysfunction (impaired relaxation).  2. Right ventricular systolic function is normal. The right ventricular size is normal.  3. The mitral valve is normal in structure. Trivial mitral valve regurgitation.   4. The aortic valve is tricuspid. There is mild calcification of the aortic valve. There is mild thickening of the aortic valve. Aortic valve regurgitation is trivial. Mild aortic valve sclerosis is present, with no evidence of aortic valve stenosis.  5. Aortic dilatation noted. There is borderline dilatation of the aortic root, measuring 36 mm. There is mild dilatation of the ascending aorta, measuring 40 mm. Conclusion(s)/Recommendation(s): No intracardiac source of embolism detected on this transthoracic study. A transesophageal echocardiogram is recommended to exclude cardiac source of embolism if clinically indicated. FINDINGS  Left Ventricle: Left ventricular ejection fraction, by estimation, is 60 to 65%. The left ventricle has normal function. The left ventricle has no regional wall motion abnormalities. The left ventricular internal cavity size was normal in size. There is  moderate eccentric left ventricular hypertrophy of the basal-septal segment. Left ventricular diastolic parameters are consistent with Grade I diastolic dysfunction (impaired relaxation). Right Ventricle: The right ventricular size is normal. No increase in right ventricular wall thickness. Right ventricular systolic function is normal. Left Atrium: Left atrial size was normal in size. Right Atrium: Right atrial size was normal in size. Pericardium: There is no evidence of pericardial effusion. Mitral Valve: The mitral valve is normal in structure. There is mild thickening of the mitral valve leaflet(s). Mild mitral annular calcification. Trivial mitral valve regurgitation. Tricuspid Valve: The tricuspid valve is normal in structure. Tricuspid valve regurgitation is not demonstrated. Aortic Valve: The aortic valve is tricuspid. There is mild calcification of the aortic valve. There is mild thickening of the aortic valve. Aortic valve regurgitation is trivial. Mild aortic valve sclerosis is present, with no evidence of aortic valve  stenosis. Pulmonic Valve: The pulmonic valve was normal in structure. Pulmonic valve regurgitation is not visualized. Aorta: Aortic dilatation noted. There is borderline dilatation of the aortic root, measuring 36 mm. There is mild dilatation of the ascending aorta, measuring 40 mm. IAS/Shunts: The interatrial septum was not well visualized. No shunting seen based on limited views.  LEFT VENTRICLE PLAX 2D LVIDd:         3.50 cm  Diastology LVIDs:         2.40 cm  LV e' medial:    3.92 cm/s LV PW:         0.95 cm  LV E/e' medial:  19.8 LV IVS:        1.55 cm  LV e' lateral:   5.98 cm/s LVOT diam:     1.90 cm  LV E/e' lateral: 13.0 LV SV:         39 LVOT Area:     2.84 cm  RIGHT VENTRICLE RV S prime:     11.60 cm/s TAPSE (M-mode): 1.6 cm LEFT ATRIUM LA Vol (A2C):   18.2 ml LA Vol (A4C):   46.2 ml LA Biplane Vol: 29.8 ml  AORTIC VALVE LVOT Vmax:   80.90 cm/s LVOT Vmean:  52.000 cm/s LVOT VTI:  0.137 m  AORTA Ao Root diam: 3.60 cm Ao Asc diam:  3.90 cm MITRAL VALVE MV Area (PHT): 3.81 cm     SHUNTS MV Decel Time: 199 msec     Systemic VTI:  0.14 m MV E velocity: 77.60 cm/s   Systemic Diam: 1.90 cm MV A velocity: 103.00 cm/s MV E/A ratio:  0.75 Gwyndolyn Kaufman MD Electronically signed by Gwyndolyn Kaufman MD Signature Date/Time: 07/16/2020/1:17:17 PM    Final    VAS US CAROTID (at [facility] and [facility] only)  Result Date: 07/16/2020 Carotid Arterial Duplex Study Indications:       TIA. Risk Factors:      Hyperlipidemia, Diabetes. Limitations        Today's exam was limited due to the high bifurcation of the                    carotid. Comparison Study:  No prior studies. Performing Technologist: Darlin Coco RDMS, RVT  Examination Guidelines: A complete evaluation includes B-mode imaging, spectral Doppler, color Doppler, and power Doppler as needed of all accessible portions of each vessel. Bilateral testing is considered an integral part of a complete examination. Limited examinations for reoccurring indications may be  performed as noted.  Right Carotid Findings: +----------+--------+--------+--------+------------------+--------+           PSV cm/sEDV cm/sStenosisPlaque DescriptionComments +----------+--------+--------+--------+------------------+--------+ CCA Prox  78      21                                         +----------+--------+--------+--------+------------------+--------+ CCA Distal55      15              calcific                   +----------+--------+--------+--------+------------------+--------+ ICA Prox  44      16      1-39%   calcific                   +----------+--------+--------+--------+------------------+--------+ ICA Distal71      21                                         +----------+--------+--------+--------+------------------+--------+ ECA       49                                                 +----------+--------+--------+--------+------------------+--------+ +----------+--------+-------+----------------+-------------------+           PSV cm/sEDV cmsDescribe        Arm Pressure (mmHG) +----------+--------+-------+----------------+-------------------+ [id number]             Multiphasic, WNL                    +----------+--------+-------+----------------+-------------------+ +---------+--------+--+--------+--+---------+ VertebralPSV cm/s43EDV cm/s10Antegrade +---------+--------+--+--------+--+---------+  Left Carotid Findings: +----------+--------+--------+--------+------------------+--------+           PSV cm/sEDV cm/sStenosisPlaque DescriptionComments +----------+--------+--------+--------+------------------+--------+ CCA Prox  73      19                                         +----------+--------+--------+--------+------------------+--------+  CCA Distal57      18                                         +----------+--------+--------+--------+------------------+--------+ ICA Prox  67      16      1-39%    hyperechoic                +----------+--------+--------+--------+------------------+--------+ ICA Distal67      24                                         +----------+--------+--------+--------+------------------+--------+ ECA       57                                                 +----------+--------+--------+--------+------------------+--------+ +----------+--------+--------+----------------+-------------------+           PSV cm/sEDV cm/sDescribe        Arm Pressure (mmHG) +----------+--------+--------+----------------+-------------------+ [id number]              Multiphasic, WNL                    +----------+--------+--------+----------------+-------------------+ +---------+--------+--+--------+--+---------+ VertebralPSV cm/s47EDV cm/s13Antegrade +---------+--------+--+--------+--+---------+   Summary: Right Carotid: Velocities in the right ICA are consistent with a 1-39% stenosis. Left Carotid: Velocities in the left ICA are consistent with a 1-39% stenosis. Vertebrals:  Bilateral vertebral arteries demonstrate antegrade flow. Subclavians: Normal flow hemodynamics were seen in bilateral subclavian              arteries. *See table(s) above for measurements and observations.  Electronically signed by Antony Contras MD on 07/16/2020 at 1:43:47 PM.    Final       Subjective: - no chest pain, shortness of breath, no abdominal pain, nausea or vomiting.   Discharge Exam: BP 128/73 (BP Location: Left Arm)   Pulse 100   Temp (!) 97.4 F (36.3 C) (Oral)   Resp 19   Ht 5' 10.75" (1.797 m) Comment: from 06/04/20 encounter  Wt 83.1 kg   SpO2 94%   BMI 25.73 kg/m   General: Pt is alert, awake, not in acute distress Cardiovascular: RRR, S1/S2 +, no rubs, no gallops Respiratory: CTA bilaterally, no wheezing, no rhonchi Abdominal: Soft, NT, ND, bowel sounds + Extremities: no edema, no cyanosis    The results of significant diagnostics from this hospitalization  (including imaging, microbiology, ancillary and laboratory) are listed below for reference.     Microbiology: Recent Results (from the past 240 hour(s))  Resp Panel by RT-PCR (Flu A&B, Covid) Nasopharyngeal Swab     Status: None   Collection Time: 07/16/20  4:00 AM   Specimen: Nasopharyngeal Swab; Nasopharyngeal(NP) swabs in vial transport medium  Result Value Ref Range Status   SARS Coronavirus 2 by RT PCR NEGATIVE NEGATIVE Final    Comment: (NOTE) SARS-CoV-2 target nucleic acids are NOT DETECTED.  The SARS-CoV-2 RNA is generally detectable in upper respiratory specimens during the acute phase of infection. The lowest concentration of SARS-CoV-2 viral copies this assay can detect is 138 copies/mL. A negative result does not preclude SARS-Cov-2 infection and should not be used as the sole basis for treatment or other  patient management decisions. A negative result may occur with  improper specimen collection/handling, submission of specimen other than nasopharyngeal swab, presence of viral mutation(s) within the areas targeted by this assay, and inadequate number of viral copies(<138 copies/mL). A negative result must be combined with clinical observations, patient history, and epidemiological information. The expected result is Negative.  Fact Sheet for Patients:  EntrepreneurPulse.com.au  Fact Sheet for Healthcare Providers:  IncredibleEmployment.be  This test is no t yet approved or cleared by the [place] FDA and  has been authorized for detection and/or diagnosis of SARS-CoV-2 by FDA under an Emergency Use Authorization (EUA). This EUA will remain  in effect (meaning this test can be used) for the duration of the COVID-19 declaration under Section 564(b)(1) of the Act, 21 U.S.C.section 360bbb-3(b)(1), unless the authorization is terminated  or revoked sooner.       Influenza A by PCR NEGATIVE NEGATIVE Final   Influenza B by PCR  NEGATIVE NEGATIVE Final    Comment: (NOTE) The Xpert Xpress SARS-CoV-2/FLU/RSV plus assay is intended as an aid in the diagnosis of influenza from Nasopharyngeal swab specimens and should not be used as a sole basis for treatment. Nasal washings and aspirates are unacceptable for Xpert Xpress SARS-CoV-2/FLU/RSV testing.  Fact Sheet for Patients: EntrepreneurPulse.com.au  Fact Sheet for Healthcare Providers: IncredibleEmployment.be  This test is not yet approved or cleared by the [place] FDA and has been authorized for detection and/or diagnosis of SARS-CoV-2 by FDA under an Emergency Use Authorization (EUA). This EUA will remain in effect (meaning this test can be used) for the duration of the COVID-19 declaration under Section 564(b)(1) of the Act, 21 U.S.C. section 360bbb-3(b)(1), unless the authorization is terminated or revoked.  Performed at [facility], [place] [address], [place], [place] [address]   Urine culture     Status: Abnormal   Collection Time: 07/16/20  5:18 AM   Specimen: Urine, Random  Result Value Ref Range Status   Specimen Description URINE, RANDOM  Final   Special Requests NONE  Final   Culture (A)  Final    <10,000 COLONIES/mL INSIGNIFICANT GROWTH Performed at [facility], [place] [address] [place], [place] [address]    Report Status 07/17/2020 FINAL  Final  SARS CORONAVIRUS 2 (TAT 6-24 HRS) Nasopharyngeal Nasopharyngeal Swab     Status: None   Collection Time: 07/18/20  6:08 PM   Specimen: Nasopharyngeal Swab  Result Value Ref Range Status   SARS Coronavirus 2 NEGATIVE NEGATIVE Final    Comment: (NOTE) SARS-CoV-2 target nucleic acids are NOT DETECTED.  The SARS-CoV-2 RNA is generally detectable in upper and lower respiratory specimens during the acute phase of infection. Negative results do not preclude SARS-CoV-2 infection, do not rule out co-infections with other pathogens, and should not be  used as the sole basis for treatment or other patient management decisions. Negative results must be combined with clinical observations, patient history, and epidemiological information. The expected result is Negative.  Fact Sheet for Patients: SugarRoll.be  Fact Sheet for Healthcare Providers: https://www.woods-mathews.com/  This test is not yet approved or cleared by the [place] FDA and  has been authorized for detection and/or diagnosis of SARS-CoV-2 by FDA under an Emergency Use Authorization (EUA). This EUA will remain  in effect (meaning this test can be used) for the duration of the COVID-19 declaration under Se ction 564(b)(1) of the Act, 21 U.S.C. section 360bbb-3(b)(1), unless the authorization is terminated or revoked sooner.  Performed at  [facility], [place] [address], [place], [place] [address]      Labs: Basic Metabolic Panel: Recent Labs  Lab 07/16/20 0357 07/18/20 0230  NA 137 132*  K 3.9 4.2  CL 101 99  CO2 25 23  GLUCOSE 143* 172*  BUN 18 13  CREATININE 0.88 0.64  CALCIUM 9.6 9.1  MG  --  1.9  PHOS  --  3.8   Liver Function Tests: Recent Labs  Lab 07/16/20 0357 07/18/20 0230  AST 21 22  ALT 18 20  ALKPHOS 68 63  BILITOT 0.9 1.0  PROT 6.6 6.3*  ALBUMIN 3.6 3.3*   CBC: Recent Labs  Lab 07/16/20 0357 07/18/20 0230  WBC 5.8 4.4  NEUTROABS 4.4  --   HGB 13.0 12.9*  HCT 41.0 37.8*  MCV 88.7 85.5  PLT 130* 120*   CBG: Recent Labs  Lab 07/18/20 0730 07/18/20 1149 07/18/20 1555 07/18/20 2115 07/19/20 0617  GLUCAP 156* 161* 128* 163* 154*   Hgb A1c Recent Labs    07/17/20 0424  HGBA1C 6.8*   Lipid Profile Recent Labs    07/17/20 0424  CHOL 156  HDL 47  LDLCALC 81  TRIG 138  CHOLHDL 3.3   Thyroid function studies Recent Labs    07/17/20 0424  TSH 1.468   Urinalysis    Component Value Date/Time   COLORURINE YELLOW 07/16/2020 0518   APPEARANCEUR CLEAR  07/16/2020 0518   LABSPEC 1.017 07/16/2020 0518   PHURINE 5.0 07/16/2020 0518   GLUCOSEU NEGATIVE 07/16/2020 0518   HGBUR NEGATIVE 07/16/2020 0518   BILIRUBINUR NEGATIVE 07/16/2020 0518   KETONESUR NEGATIVE 07/16/2020 0518   PROTEINUR 30 (A) 07/16/2020 0518   UROBILINOGEN 0.2 03/05/2007 1537   NITRITE NEGATIVE 07/16/2020 0518   LEUKOCYTESUR NEGATIVE 07/16/2020 0518    FURTHER DISCHARGE INSTRUCTIONS:   Get Medicines reviewed and adjusted: Please take all your medications with you for your next visit with your Primary MD   Laboratory/radiological data: Please request your Primary MD to go over all hospital tests and procedure/radiological results at the follow up, please ask your Primary MD to get all Hospital records sent to his/her office.   In some cases, they will be blood work, cultures and biopsy results pending at the time of your discharge. Please request that your primary care M.D. goes through all the records of your hospital data and follows up on these results.   Also Note the following: If you experience worsening of your admission symptoms, develop shortness of breath, life threatening emergency, suicidal or homicidal thoughts you must seek medical attention immediately by calling 911 or calling your MD immediately  if symptoms less severe.   You must read complete instructions/literature along with all the possible adverse reactions/side effects for all the Medicines you take and that have been prescribed to you. Take any new Medicines after you have completely understood and accpet all the possible adverse reactions/side effects.    Do not drive when taking Pain medications or sleeping medications (Benzodaizepines)   Do not take more than prescribed Pain, Sleep and Anxiety Medications. It is not advisable to combine anxiety,sleep and pain medications without talking with your primary care practitioner   Special Instructions: If you have smoked or chewed Tobacco  in the  last 2 yrs please stop smoking, stop any regular Alcohol  and or any Recreational drug use.   Wear Seat belts while driving.   Please note: You were cared for by a hospitalist during your  hospital stay. Once you are discharged, your primary care physician will handle any further medical issues. Please note that NO REFILLS for any discharge medications will be authorized once you are discharged, as it is imperative that you return to your primary care physician (or establish a relationship with a primary care physician if you do not have one) for your post hospital discharge needs so that they can reassess your need for medications and monitor your lab values.  Time coordinating discharge: 40 minutes  SIGNED:  Marzetta Board, MD, PhD 07/19/2020, 9:35 AM

## 2020-07-23 ENCOUNTER — Other Ambulatory Visit: Payer: Self-pay | Admitting: *Deleted

## 2020-07-23 NOTE — Patient Outreach (Signed)
Member screened for potential [facility] Care Management needs.  Per [place] (Patient Albert Rogers) member resides in [facility].  Communication sent to facility SW to inquire about transition plans and potential [facility] needs.   Marthenia Rolling, MSN, RN,BSN [place] Acute Care Coordinator [phone] [facility]) [phone]  (Toll free office)

## 2020-07-26 DIAGNOSIS — C61 Malignant neoplasm of prostate: Secondary | ICD-10-CM | POA: Diagnosis not present

## 2020-07-26 DIAGNOSIS — I10 Essential (primary) hypertension: Secondary | ICD-10-CM | POA: Diagnosis not present

## 2020-07-26 DIAGNOSIS — F0390 Unspecified dementia without behavioral disturbance: Secondary | ICD-10-CM | POA: Diagnosis not present

## 2020-07-26 DIAGNOSIS — G9341 Metabolic encephalopathy: Secondary | ICD-10-CM | POA: Diagnosis not present

## 2020-07-26 DIAGNOSIS — E1142 Type 2 diabetes mellitus with diabetic polyneuropathy: Secondary | ICD-10-CM | POA: Diagnosis not present

## 2020-07-26 DIAGNOSIS — H3530 Unspecified macular degeneration: Secondary | ICD-10-CM | POA: Diagnosis not present

## 2020-07-26 DIAGNOSIS — E538 Deficiency of other specified B group vitamins: Secondary | ICD-10-CM | POA: Diagnosis not present

## 2020-07-26 DIAGNOSIS — E785 Hyperlipidemia, unspecified: Secondary | ICD-10-CM | POA: Diagnosis not present

## 2020-07-26 DIAGNOSIS — J690 Pneumonitis due to inhalation of food and vomit: Secondary | ICD-10-CM | POA: Diagnosis not present

## 2020-07-29 ENCOUNTER — Other Ambulatory Visit: Payer: Self-pay | Admitting: *Deleted

## 2020-07-29 NOTE — Patient Outreach (Addendum)
Member screened for potential [facility] Care Management needs.  Mr. Scribner resides in [facility]. Update received from [place] indicating member will transition to home next Tuesday with spouse. Reports Mr. Ebers's wife is looking into additional caregiver assistance. He will have home health services arranged. SW reports member could benefit from additional [facility] support.  Will plan outreach to Mrs. Fromme to discuss [place] Management services and potentially [facility].    Marthenia Rolling, MSN, RN,BSN [place] Acute Care Coordinator [phone] [facility]) [phone]  (Toll free office)

## 2020-07-30 ENCOUNTER — Other Ambulatory Visit: Payer: Self-pay | Admitting: *Deleted

## 2020-07-30 NOTE — Patient Outreach (Signed)
[facility] Post- Acute Care Coordinator follow up.   Telephone call made to member's spouse Jacier Gladu [phone]. Patient identifiers confirmed.  Mrs. Harvie confirms that member will come home on next Tuesday, March 22nd. Confirms that she has received information from SNF SW regarding caregiver agencies and life alert system. Confirms Mr. Lanz has all the necessary equipment at home. States she will contact caregiver agencies "if" she thinks Mr. Pitsenbarger needs it when he gets home. States she wants to see how he does first.   Mrs. Kassab reports their daughter will stay with them for the first week to assist. Reports member has urologist and PCP appointments scheduled. Denies any concerns with transportation.   Discussed [facility] for home visits due to member's high risk for readmission and high risk for falls. Mrs. Fendley is agreeable. Referral made to [facility].   Will follow up with facility SW to make aware and to inquire about home health agency name.  Mr. Kirksey is scheduled to transition home on Tuesday, March 22nd from [facility].   Marthenia Rolling, MSN, RN,BSN [place] Acute Care Coordinator [phone] [facility]) [phone]  (Toll free office)

## 2020-08-05 DIAGNOSIS — Z9181 History of falling: Secondary | ICD-10-CM | POA: Diagnosis not present

## 2020-08-05 DIAGNOSIS — Z79899 Other long term (current) drug therapy: Secondary | ICD-10-CM | POA: Diagnosis not present

## 2020-08-05 DIAGNOSIS — M8580 Other specified disorders of bone density and structure, unspecified site: Secondary | ICD-10-CM | POA: Diagnosis not present

## 2020-08-05 DIAGNOSIS — Z7984 Long term (current) use of oral hypoglycemic drugs: Secondary | ICD-10-CM | POA: Diagnosis not present

## 2020-08-05 DIAGNOSIS — C61 Malignant neoplasm of prostate: Secondary | ICD-10-CM | POA: Diagnosis not present

## 2020-08-05 DIAGNOSIS — G9340 Encephalopathy, unspecified: Secondary | ICD-10-CM | POA: Diagnosis not present

## 2020-08-05 DIAGNOSIS — R2681 Unsteadiness on feet: Secondary | ICD-10-CM | POA: Diagnosis not present

## 2020-08-05 DIAGNOSIS — M6281 Muscle weakness (generalized): Secondary | ICD-10-CM | POA: Diagnosis not present

## 2020-08-05 DIAGNOSIS — E538 Deficiency of other specified B group vitamins: Secondary | ICD-10-CM | POA: Diagnosis not present

## 2020-08-05 DIAGNOSIS — E785 Hyperlipidemia, unspecified: Secondary | ICD-10-CM | POA: Diagnosis not present

## 2020-08-05 DIAGNOSIS — H353290 Exudative age-related macular degeneration, unspecified eye, stage unspecified: Secondary | ICD-10-CM | POA: Diagnosis not present

## 2020-08-05 DIAGNOSIS — E119 Type 2 diabetes mellitus without complications: Secondary | ICD-10-CM | POA: Diagnosis not present

## 2020-08-05 DIAGNOSIS — I10 Essential (primary) hypertension: Secondary | ICD-10-CM | POA: Diagnosis not present

## 2020-08-05 DIAGNOSIS — R4181 Age-related cognitive decline: Secondary | ICD-10-CM | POA: Diagnosis not present

## 2020-08-05 DIAGNOSIS — M5459 Other low back pain: Secondary | ICD-10-CM | POA: Diagnosis not present

## 2020-08-05 DIAGNOSIS — F0390 Unspecified dementia without behavioral disturbance: Secondary | ICD-10-CM | POA: Diagnosis not present

## 2020-08-05 DIAGNOSIS — Z5111 Encounter for antineoplastic chemotherapy: Secondary | ICD-10-CM | POA: Diagnosis not present

## 2020-08-05 DIAGNOSIS — R278 Other lack of coordination: Secondary | ICD-10-CM | POA: Diagnosis not present

## 2020-08-07 DIAGNOSIS — E119 Type 2 diabetes mellitus without complications: Secondary | ICD-10-CM | POA: Diagnosis not present

## 2020-08-07 DIAGNOSIS — G9340 Encephalopathy, unspecified: Secondary | ICD-10-CM | POA: Diagnosis not present

## 2020-08-07 DIAGNOSIS — E538 Deficiency of other specified B group vitamins: Secondary | ICD-10-CM | POA: Diagnosis not present

## 2020-08-07 DIAGNOSIS — F0390 Unspecified dementia without behavioral disturbance: Secondary | ICD-10-CM | POA: Diagnosis not present

## 2020-08-07 DIAGNOSIS — C61 Malignant neoplasm of prostate: Secondary | ICD-10-CM | POA: Diagnosis not present

## 2020-08-07 DIAGNOSIS — I10 Essential (primary) hypertension: Secondary | ICD-10-CM | POA: Diagnosis not present

## 2020-08-11 DIAGNOSIS — F0390 Unspecified dementia without behavioral disturbance: Secondary | ICD-10-CM | POA: Diagnosis not present

## 2020-08-11 DIAGNOSIS — G9340 Encephalopathy, unspecified: Secondary | ICD-10-CM | POA: Diagnosis not present

## 2020-08-11 DIAGNOSIS — E119 Type 2 diabetes mellitus without complications: Secondary | ICD-10-CM | POA: Diagnosis not present

## 2020-08-11 DIAGNOSIS — E538 Deficiency of other specified B group vitamins: Secondary | ICD-10-CM | POA: Diagnosis not present

## 2020-08-11 DIAGNOSIS — I10 Essential (primary) hypertension: Secondary | ICD-10-CM | POA: Diagnosis not present

## 2020-08-11 DIAGNOSIS — R5383 Other fatigue: Secondary | ICD-10-CM | POA: Diagnosis not present

## 2020-08-11 DIAGNOSIS — C61 Malignant neoplasm of prostate: Secondary | ICD-10-CM | POA: Diagnosis not present

## 2020-08-13 DIAGNOSIS — G9340 Encephalopathy, unspecified: Secondary | ICD-10-CM | POA: Diagnosis not present

## 2020-08-13 DIAGNOSIS — E538 Deficiency of other specified B group vitamins: Secondary | ICD-10-CM | POA: Diagnosis not present

## 2020-08-13 DIAGNOSIS — C61 Malignant neoplasm of prostate: Secondary | ICD-10-CM | POA: Diagnosis not present

## 2020-08-13 DIAGNOSIS — F0390 Unspecified dementia without behavioral disturbance: Secondary | ICD-10-CM | POA: Diagnosis not present

## 2020-08-13 DIAGNOSIS — I10 Essential (primary) hypertension: Secondary | ICD-10-CM | POA: Diagnosis not present

## 2020-08-13 DIAGNOSIS — E119 Type 2 diabetes mellitus without complications: Secondary | ICD-10-CM | POA: Diagnosis not present

## 2020-08-17 DIAGNOSIS — E538 Deficiency of other specified B group vitamins: Secondary | ICD-10-CM | POA: Diagnosis not present

## 2020-08-17 DIAGNOSIS — C61 Malignant neoplasm of prostate: Secondary | ICD-10-CM | POA: Diagnosis not present

## 2020-08-17 DIAGNOSIS — E119 Type 2 diabetes mellitus without complications: Secondary | ICD-10-CM | POA: Diagnosis not present

## 2020-08-17 DIAGNOSIS — F0390 Unspecified dementia without behavioral disturbance: Secondary | ICD-10-CM | POA: Diagnosis not present

## 2020-08-17 DIAGNOSIS — I10 Essential (primary) hypertension: Secondary | ICD-10-CM | POA: Diagnosis not present

## 2020-08-17 DIAGNOSIS — G9340 Encephalopathy, unspecified: Secondary | ICD-10-CM | POA: Diagnosis not present

## 2020-08-20 DIAGNOSIS — E538 Deficiency of other specified B group vitamins: Secondary | ICD-10-CM | POA: Diagnosis not present

## 2020-08-20 DIAGNOSIS — F0390 Unspecified dementia without behavioral disturbance: Secondary | ICD-10-CM | POA: Diagnosis not present

## 2020-08-20 DIAGNOSIS — I10 Essential (primary) hypertension: Secondary | ICD-10-CM | POA: Diagnosis not present

## 2020-08-20 DIAGNOSIS — C61 Malignant neoplasm of prostate: Secondary | ICD-10-CM | POA: Diagnosis not present

## 2020-08-20 DIAGNOSIS — G9340 Encephalopathy, unspecified: Secondary | ICD-10-CM | POA: Diagnosis not present

## 2020-08-20 DIAGNOSIS — E119 Type 2 diabetes mellitus without complications: Secondary | ICD-10-CM | POA: Diagnosis not present

## 2020-08-24 DIAGNOSIS — C61 Malignant neoplasm of prostate: Secondary | ICD-10-CM | POA: Diagnosis not present

## 2020-08-24 DIAGNOSIS — F0390 Unspecified dementia without behavioral disturbance: Secondary | ICD-10-CM | POA: Diagnosis not present

## 2020-08-24 DIAGNOSIS — G9340 Encephalopathy, unspecified: Secondary | ICD-10-CM | POA: Diagnosis not present

## 2020-08-24 DIAGNOSIS — E538 Deficiency of other specified B group vitamins: Secondary | ICD-10-CM | POA: Diagnosis not present

## 2020-08-24 DIAGNOSIS — I10 Essential (primary) hypertension: Secondary | ICD-10-CM | POA: Diagnosis not present

## 2020-08-24 DIAGNOSIS — E119 Type 2 diabetes mellitus without complications: Secondary | ICD-10-CM | POA: Diagnosis not present

## 2020-08-31 DIAGNOSIS — E538 Deficiency of other specified B group vitamins: Secondary | ICD-10-CM | POA: Diagnosis not present

## 2020-08-31 DIAGNOSIS — G9340 Encephalopathy, unspecified: Secondary | ICD-10-CM | POA: Diagnosis not present

## 2020-08-31 DIAGNOSIS — C61 Malignant neoplasm of prostate: Secondary | ICD-10-CM | POA: Diagnosis not present

## 2020-08-31 DIAGNOSIS — I10 Essential (primary) hypertension: Secondary | ICD-10-CM | POA: Diagnosis not present

## 2020-08-31 DIAGNOSIS — E119 Type 2 diabetes mellitus without complications: Secondary | ICD-10-CM | POA: Diagnosis not present

## 2020-08-31 DIAGNOSIS — F0390 Unspecified dementia without behavioral disturbance: Secondary | ICD-10-CM | POA: Diagnosis not present

## 2020-09-02 DIAGNOSIS — H353221 Exudative age-related macular degeneration, left eye, with active choroidal neovascularization: Secondary | ICD-10-CM | POA: Diagnosis not present

## 2020-09-03 ENCOUNTER — Other Ambulatory Visit: Payer: Self-pay

## 2020-09-03 ENCOUNTER — Encounter (HOSPITAL_BASED_OUTPATIENT_CLINIC_OR_DEPARTMENT_OTHER): Payer: Self-pay

## 2020-09-03 ENCOUNTER — Emergency Department (HOSPITAL_BASED_OUTPATIENT_CLINIC_OR_DEPARTMENT_OTHER)
Admission: EM | Admit: 2020-09-03 | Discharge: 2020-09-03 | Disposition: A | Discharge status: Home / Self Care | Payer: Medicare Other | Attending: Emergency Medicine | Admitting: Emergency Medicine

## 2020-09-03 DIAGNOSIS — E119 Type 2 diabetes mellitus without complications: Secondary | ICD-10-CM | POA: Insufficient documentation

## 2020-09-03 DIAGNOSIS — I10 Essential (primary) hypertension: Secondary | ICD-10-CM | POA: Diagnosis not present

## 2020-09-03 DIAGNOSIS — Z87891 Personal history of nicotine dependence: Secondary | ICD-10-CM | POA: Diagnosis not present

## 2020-09-03 DIAGNOSIS — Z79899 Other long term (current) drug therapy: Secondary | ICD-10-CM | POA: Diagnosis not present

## 2020-09-03 DIAGNOSIS — Z7984 Long term (current) use of oral hypoglycemic drugs: Secondary | ICD-10-CM | POA: Insufficient documentation

## 2020-09-03 DIAGNOSIS — E538 Deficiency of other specified B group vitamins: Secondary | ICD-10-CM | POA: Diagnosis not present

## 2020-09-03 DIAGNOSIS — F0390 Unspecified dementia without behavioral disturbance: Secondary | ICD-10-CM | POA: Diagnosis not present

## 2020-09-03 DIAGNOSIS — G9340 Encephalopathy, unspecified: Secondary | ICD-10-CM | POA: Diagnosis not present

## 2020-09-03 DIAGNOSIS — C61 Malignant neoplasm of prostate: Secondary | ICD-10-CM | POA: Diagnosis not present

## 2020-09-03 LAB — BASIC METABOLIC PANEL
Anion gap: 9 (ref 5–15)
BUN: 12 mg/dL (ref 8–23)
CO2: 26 mmol/L (ref 22–32)
Calcium: 9 mg/dL (ref 8.9–10.3)
Chloride: 100 mmol/L (ref 98–111)
Creatinine, Ser: 0.77 mg/dL (ref 0.61–1.24)
GFR, Estimated: >60 mL/min (ref >60)
Glucose, Bld: 144 mg/dL — ABNORMAL HIGH (ref 70–99)
Potassium: 3.9 mmol/L (ref 3.5–5.1)
Sodium: 135 mmol/L (ref 135–145)

## 2020-09-03 LAB — CBC WITH DIFFERENTIAL/PLATELET
Abs Immature Granulocytes: 0.02 10*3/uL (ref 0.00–0.07)
Basophils Absolute: 0 10*3/uL (ref 0.0–0.1)
Basophils Relative: 1 %
Eosinophils Absolute: 0.1 10*3/uL (ref 0.0–0.5)
Eosinophils Relative: 1 %
HCT: 36.8 % — ABNORMAL LOW (ref 39.0–52.0)
Hemoglobin: 12.2 g/dL — ABNORMAL LOW (ref 13.0–17.0)
Immature Granulocytes: 1 %
Lymphocytes Relative: 23 %
Lymphs Abs: 0.9 10*3/uL (ref 0.7–4.0)
MCH: 29 pg (ref 26.0–34.0)
MCHC: 33.2 g/dL (ref 30.0–36.0)
MCV: 87.6 fL (ref 80.0–100.0)
Monocytes Absolute: 0.5 10*3/uL (ref 0.1–1.0)
Monocytes Relative: 12 %
Neutro Abs: 2.3 10*3/uL (ref 1.7–7.7)
Neutrophils Relative %: 62 %
Platelets: 127 10*3/uL — ABNORMAL LOW (ref 150–400)
RBC: 4.2 MIL/uL — ABNORMAL LOW (ref 4.22–5.81)
RDW: 16 % — ABNORMAL HIGH (ref 11.5–15.5)
WBC: 3.7 10*3/uL — ABNORMAL LOW (ref 4.0–10.5)
nRBC: 0 % (ref 0.0–0.2)

## 2020-09-03 NOTE — ED Provider Notes (Signed)
[place] EMERGENCY DEPARTMENT Provider Note   CSN: [id number] Arrival date & time: 09/03/20  1737     History Chief Complaint  Patient presents with  . Hypertension    Albert Rogers is a 87 y.o. male who presents for evaluation of hypertension.  The patient has no previous history of hypertension but was hypertensive during her recent admission and was started on amlodipine.  This was discontinued by his primary care Dr. Donnie Coffin when he arrived at his office with a blood pressure of a systolic of 601.  At that time he was feeling very tired.  Patient states that he was doing well and had gone to rehab therapy for for weakness in his lower extremities.  He asked his physical therapist to check his blood pressure and it was elevated.  When he got home he used his wrist monitor and it read 115/199.  This was extremely concerning to him and his wife.  They called their daughter and it did come down but was still elevated highly on the diastolic side.  They called the fire department who came by and checked his blood pressure.  It was mildly elevated and they decided to come to the ER for further evaluation.  He says "I feel great I just do not understand why my blood pressure was so high."  He has a history of macular degeneration denies any other vision changes, headache, shortness of breath, chest pain, ataxia.  He denies increase in his salt intake  HPI     Past Medical History:  Diagnosis Date  . Diabetes ([place])   . Macular degeneration   . Prostate cancer [facility])     Patient Active Problem List   Diagnosis Date Noted  . AMS (altered mental status) 07/17/2020  . Acute metabolic encephalopathy 09/32/3557  . Type 2 diabetes mellitus without complication ([place]) 32/20/2542  . Dyslipidemia 07/16/2020  . Exudative senile macular degeneration of retina ([place]) 07/16/2020    History reviewed. No pertinent surgical history.     No family history on file.  Social  History   Tobacco Use  . Smoking status: Former Research scientist (life sciences)  . Smokeless tobacco: Never Used  Substance Use Topics  . Alcohol use: Not Currently  . Drug use: Never    Home Medications Prior to Admission medications   Medication Sig Start Date End Date Taking? Authorizing Provider  amLODipine (NORVASC) 5 MG tablet Take 1 tablet (5 mg total) by mouth daily. 07/20/20   Caren Griffins, MD  metFORMIN (GLUCOPHAGE) 500 MG tablet Take 500 mg by mouth 2 (two) times daily. 07/01/20   [provider]  Multiple Vitamins-Minerals (OCUVITE ADULT 50+) CAPS Take 1 capsule by mouth daily.    [provider]  rosuvastatin (CRESTOR) 10 MG tablet Take 10 mg by mouth daily. 07/03/20   [provider]  VITAMIN D PO Take 1 capsule by mouth daily.    [provider]    Allergies    Codeine  Review of Systems   Review of Systems Ten systems reviewed and are negative for acute change, except as noted in the HPI.   Physical Exam Updated Vital Signs BP (!) 149/111   Pulse 96   Temp 98.3 F (36.8 C) (Oral)   Resp (!) 25   Ht 5\' 11"  (1.803 m)   Wt 87.1 kg   SpO2 97%   BMI 26.78 kg/m   Physical Exam Vitals and nursing note reviewed.  Constitutional:  General: He is not in acute distress.    Appearance: He is well-developed. He is not diaphoretic.  HENT:     Head: Normocephalic and atraumatic.  Eyes:     General: No scleral icterus.    Conjunctiva/sclera: Conjunctivae normal.  Cardiovascular:     Rate and Rhythm: Normal rate and regular rhythm.     Heart sounds: Normal heart sounds.  Pulmonary:     Effort: Pulmonary effort is normal. No respiratory distress.     Breath sounds: Normal breath sounds.  Abdominal:     Palpations: Abdomen is soft.     Tenderness: There is no abdominal tenderness.  Musculoskeletal:     Cervical back: Normal range of motion and neck supple.  Skin:    General: Skin is warm and dry.  Neurological:     General: No focal deficit  present.     Mental Status: He is alert and oriented to person, place, and time.     Cranial Nerves: No cranial nerve deficit.     Sensory: No sensory deficit.     Motor: No weakness.     Coordination: Coordination normal.     Deep Tendon Reflexes: Reflexes normal.  Psychiatric:        Behavior: Behavior normal.     ED Results / Procedures / Treatments   Labs (all labs ordered are listed, but only abnormal results are displayed) Labs Reviewed  BASIC METABOLIC PANEL - Abnormal; Notable for the following components:      Result Value   Glucose, Bld 144 (*)    All other components within normal limits  CBC WITH DIFFERENTIAL/PLATELET - Abnormal; Notable for the following components:   WBC 3.7 (*)    RBC 4.20 (*)    Hemoglobin 12.2 (*)    HCT 36.8 (*)    RDW 16.0 (*)    Platelets 127 (*)    All other components within normal limits    EKG EKG Interpretation  Date/Time:  Thursday September 03 2020 17:59:54 EDT Ventricular Rate:  98 PR Interval:  178 QRS Duration: 80 QT Interval:  339 QTC Calculation: 433 R Axis:   -12 Text Interpretation: Sinus rhythm Inferior infarct, old No significant change since prior 3/22 Confirmed by Aletta Edouard [phone]) on 09/03/2020 6:13:51 PM   Radiology No results found.  Procedures Procedures   Medications Ordered in ED Medications - No data to display  ED Course  I have reviewed the triage vital signs and the nursing notes.  Pertinent labs & imaging results that were available during my care of the patient were reviewed by me and considered in my medical decision making (see chart for details).    MDM Rules/Calculators/A&P                          Patient here with complaint of asymptomatic hypertension.  I ordered and reviewed labs that show CBC with mild leukopenia, mild normocytic anemia of insignificant value, mild thrombocytopenia.  BMP with mildly elevated glucose of insignificant value.  EKG shows sinus rhythm at a rate of 98.  no signs of hypertensive urgency or emergency..  Discussed with patient the need for close follow-up and management by their primary care physician.   Final Clinical Impression(s) / ED Diagnoses Final diagnoses:  Asymptomatic hypertension    Rx / DC Orders ED Discharge Orders    None       Margarita Mail, PA-C 09/03/20 2344    Aletta Edouard  C, MD 09/04/20 1021

## 2020-09-03 NOTE — ED Triage Notes (Signed)
Per pt and wife pt with elevated BP today after "therapy exercises"-pt NAD-steady gait with own cane

## 2020-09-03 NOTE — Discharge Instructions (Signed)
Contact a health care provider if you: Think you are having a reaction to a medicine you are taking. Have headaches that keep coming back (recurring). Feel dizzy. Have swelling in your ankles. Have trouble with your vision. Get help right away if you: Develop a severe headache or confusion. Have unusual weakness or numbness. Feel faint. Have severe pain in your chest or abdomen. Vomit repeatedly. Have trouble breathing.

## 2020-09-04 DIAGNOSIS — H353290 Exudative age-related macular degeneration, unspecified eye, stage unspecified: Secondary | ICD-10-CM | POA: Diagnosis not present

## 2020-09-04 DIAGNOSIS — C61 Malignant neoplasm of prostate: Secondary | ICD-10-CM | POA: Diagnosis not present

## 2020-09-04 DIAGNOSIS — Z9181 History of falling: Secondary | ICD-10-CM | POA: Diagnosis not present

## 2020-09-04 DIAGNOSIS — E119 Type 2 diabetes mellitus without complications: Secondary | ICD-10-CM | POA: Diagnosis not present

## 2020-09-04 DIAGNOSIS — R278 Other lack of coordination: Secondary | ICD-10-CM | POA: Diagnosis not present

## 2020-09-04 DIAGNOSIS — F0390 Unspecified dementia without behavioral disturbance: Secondary | ICD-10-CM | POA: Diagnosis not present

## 2020-09-04 DIAGNOSIS — G9340 Encephalopathy, unspecified: Secondary | ICD-10-CM | POA: Diagnosis not present

## 2020-09-04 DIAGNOSIS — E538 Deficiency of other specified B group vitamins: Secondary | ICD-10-CM | POA: Diagnosis not present

## 2020-09-04 DIAGNOSIS — M6281 Muscle weakness (generalized): Secondary | ICD-10-CM | POA: Diagnosis not present

## 2020-09-04 DIAGNOSIS — R2681 Unsteadiness on feet: Secondary | ICD-10-CM | POA: Diagnosis not present

## 2020-09-04 DIAGNOSIS — I10 Essential (primary) hypertension: Secondary | ICD-10-CM | POA: Diagnosis not present

## 2020-09-04 DIAGNOSIS — Z79899 Other long term (current) drug therapy: Secondary | ICD-10-CM | POA: Diagnosis not present

## 2020-09-04 DIAGNOSIS — Z7984 Long term (current) use of oral hypoglycemic drugs: Secondary | ICD-10-CM | POA: Diagnosis not present

## 2020-09-04 DIAGNOSIS — M5459 Other low back pain: Secondary | ICD-10-CM | POA: Diagnosis not present

## 2020-09-04 DIAGNOSIS — E785 Hyperlipidemia, unspecified: Secondary | ICD-10-CM | POA: Diagnosis not present

## 2020-09-04 DIAGNOSIS — R4181 Age-related cognitive decline: Secondary | ICD-10-CM | POA: Diagnosis not present

## 2020-09-07 DIAGNOSIS — H353211 Exudative age-related macular degeneration, right eye, with active choroidal neovascularization: Secondary | ICD-10-CM | POA: Diagnosis not present

## 2020-09-08 DIAGNOSIS — F0390 Unspecified dementia without behavioral disturbance: Secondary | ICD-10-CM | POA: Diagnosis not present

## 2020-09-08 DIAGNOSIS — E538 Deficiency of other specified B group vitamins: Secondary | ICD-10-CM | POA: Diagnosis not present

## 2020-09-08 DIAGNOSIS — G9340 Encephalopathy, unspecified: Secondary | ICD-10-CM | POA: Diagnosis not present

## 2020-09-08 DIAGNOSIS — I10 Essential (primary) hypertension: Secondary | ICD-10-CM | POA: Diagnosis not present

## 2020-09-08 DIAGNOSIS — C61 Malignant neoplasm of prostate: Secondary | ICD-10-CM | POA: Diagnosis not present

## 2020-09-08 DIAGNOSIS — E119 Type 2 diabetes mellitus without complications: Secondary | ICD-10-CM | POA: Diagnosis not present

## 2020-09-11 DIAGNOSIS — I10 Essential (primary) hypertension: Secondary | ICD-10-CM | POA: Diagnosis not present

## 2020-09-15 DIAGNOSIS — C61 Malignant neoplasm of prostate: Secondary | ICD-10-CM | POA: Diagnosis not present

## 2020-09-15 DIAGNOSIS — F0390 Unspecified dementia without behavioral disturbance: Secondary | ICD-10-CM | POA: Diagnosis not present

## 2020-09-15 DIAGNOSIS — E119 Type 2 diabetes mellitus without complications: Secondary | ICD-10-CM | POA: Diagnosis not present

## 2020-09-15 DIAGNOSIS — I10 Essential (primary) hypertension: Secondary | ICD-10-CM | POA: Diagnosis not present

## 2020-09-15 DIAGNOSIS — E538 Deficiency of other specified B group vitamins: Secondary | ICD-10-CM | POA: Diagnosis not present

## 2020-09-15 DIAGNOSIS — G9340 Encephalopathy, unspecified: Secondary | ICD-10-CM | POA: Diagnosis not present

## 2020-09-22 DIAGNOSIS — I10 Essential (primary) hypertension: Secondary | ICD-10-CM | POA: Diagnosis not present

## 2020-09-22 DIAGNOSIS — E538 Deficiency of other specified B group vitamins: Secondary | ICD-10-CM | POA: Diagnosis not present

## 2020-09-22 DIAGNOSIS — G9340 Encephalopathy, unspecified: Secondary | ICD-10-CM | POA: Diagnosis not present

## 2020-09-22 DIAGNOSIS — C61 Malignant neoplasm of prostate: Secondary | ICD-10-CM | POA: Diagnosis not present

## 2020-09-22 DIAGNOSIS — E119 Type 2 diabetes mellitus without complications: Secondary | ICD-10-CM | POA: Diagnosis not present

## 2020-09-22 DIAGNOSIS — F0390 Unspecified dementia without behavioral disturbance: Secondary | ICD-10-CM | POA: Diagnosis not present

## 2020-09-26 ENCOUNTER — Emergency Department (HOSPITAL_BASED_OUTPATIENT_CLINIC_OR_DEPARTMENT_OTHER): Payer: Medicare Other

## 2020-09-26 ENCOUNTER — Encounter (HOSPITAL_BASED_OUTPATIENT_CLINIC_OR_DEPARTMENT_OTHER): Payer: Self-pay | Admitting: Emergency Medicine

## 2020-09-26 ENCOUNTER — Other Ambulatory Visit: Payer: Self-pay

## 2020-09-26 ENCOUNTER — Emergency Department (HOSPITAL_BASED_OUTPATIENT_CLINIC_OR_DEPARTMENT_OTHER)
Admission: EM | Admit: 2020-09-26 | Discharge: 2020-09-26 | Disposition: A | Discharge status: Home / Self Care | Payer: Medicare Other | Attending: Emergency Medicine | Admitting: Emergency Medicine

## 2020-09-26 DIAGNOSIS — L97529 Non-pressure chronic ulcer of other part of left foot with unspecified severity: Secondary | ICD-10-CM | POA: Diagnosis not present

## 2020-09-26 DIAGNOSIS — E1169 Type 2 diabetes mellitus with other specified complication: Secondary | ICD-10-CM | POA: Insufficient documentation

## 2020-09-26 DIAGNOSIS — Z8546 Personal history of malignant neoplasm of prostate: Secondary | ICD-10-CM | POA: Insufficient documentation

## 2020-09-26 DIAGNOSIS — E785 Hyperlipidemia, unspecified: Secondary | ICD-10-CM | POA: Insufficient documentation

## 2020-09-26 DIAGNOSIS — E119 Type 2 diabetes mellitus without complications: Secondary | ICD-10-CM | POA: Diagnosis not present

## 2020-09-26 DIAGNOSIS — Z79899 Other long term (current) drug therapy: Secondary | ICD-10-CM | POA: Insufficient documentation

## 2020-09-26 DIAGNOSIS — Z87891 Personal history of nicotine dependence: Secondary | ICD-10-CM | POA: Diagnosis not present

## 2020-09-26 DIAGNOSIS — R2242 Localized swelling, mass and lump, left lower limb: Secondary | ICD-10-CM | POA: Diagnosis not present

## 2020-09-26 DIAGNOSIS — M79675 Pain in left toe(s): Secondary | ICD-10-CM | POA: Diagnosis present

## 2020-09-26 DIAGNOSIS — E11621 Type 2 diabetes mellitus with foot ulcer: Secondary | ICD-10-CM | POA: Diagnosis not present

## 2020-09-26 LAB — CBC WITH DIFFERENTIAL/PLATELET
Abs Immature Granulocytes: 0.02 10*3/uL (ref 0.00–0.07)
Basophils Absolute: 0 10*3/uL (ref 0.0–0.1)
Basophils Relative: 1 %
Eosinophils Absolute: 0.1 10*3/uL (ref 0.0–0.5)
Eosinophils Relative: 2 %
HCT: 36 % — ABNORMAL LOW (ref 39.0–52.0)
Hemoglobin: 12.3 g/dL — ABNORMAL LOW (ref 13.0–17.0)
Immature Granulocytes: 0 %
Lymphocytes Relative: 21 %
Lymphs Abs: 1 10*3/uL (ref 0.7–4.0)
MCH: 29.4 pg (ref 26.0–34.0)
MCHC: 34.2 g/dL (ref 30.0–36.0)
MCV: 86.1 fL (ref 80.0–100.0)
Monocytes Absolute: 0.5 10*3/uL (ref 0.1–1.0)
Monocytes Relative: 10 %
Neutro Abs: 3.2 10*3/uL (ref 1.7–7.7)
Neutrophils Relative %: 66 %
Platelets: 129 10*3/uL — ABNORMAL LOW (ref 150–400)
RBC: 4.18 MIL/uL — ABNORMAL LOW (ref 4.22–5.81)
RDW: 15.4 % (ref 11.5–15.5)
WBC: 4.7 10*3/uL (ref 4.0–10.5)
nRBC: 0 % (ref 0.0–0.2)

## 2020-09-26 LAB — BASIC METABOLIC PANEL
Anion gap: 10 (ref 5–15)
BUN: 15 mg/dL (ref 8–23)
CO2: 22 mmol/L (ref 22–32)
Calcium: 9.1 mg/dL (ref 8.9–10.3)
Chloride: 101 mmol/L (ref 98–111)
Creatinine, Ser: 0.68 mg/dL (ref 0.61–1.24)
GFR, Estimated: >60 mL/min (ref >60)
Glucose, Bld: 148 mg/dL — ABNORMAL HIGH (ref 70–99)
Potassium: 4.3 mmol/L (ref 3.5–5.1)
Sodium: 133 mmol/L — ABNORMAL LOW (ref 135–145)

## 2020-09-26 LAB — LACTIC ACID, PLASMA: Lactic Acid, Venous: 1.1 mmol/L (ref 0.5–1.9)

## 2020-09-26 MED ORDER — DOXYCYCLINE HYCLATE 100 MG PO TABS
100.0000 mg | ORAL_TABLET | Freq: Once | ORAL | Status: AC
  Administered 2020-09-26: 100 mg via ORAL
  Filled 2020-09-26: qty 1

## 2020-09-26 MED ORDER — DOXYCYCLINE HYCLATE 100 MG PO CAPS: 100.0000 mg | ORAL_CAPSULE | Freq: Two times a day (BID) | ORAL | 0 refills | Status: DC

## 2020-09-26 NOTE — ED Triage Notes (Signed)
Reports he had a water blister with a bloody area to the bottom of the left great toe for the last three days.

## 2020-09-26 NOTE — Discharge Instructions (Addendum)
Follow-up with either your primary care doctor or podiatrist within the next 2 to 3 days for recheck of the wound.  Return here as needed if you have any worsening symptoms including worsening drainage, fevers, worsening redness or other worsening symptoms.  Take the antibiotics as directed.  Continue using the mupirocin cream to the area.

## 2020-09-26 NOTE — ED Provider Notes (Signed)
[place] EMERGENCY DEPARTMENT Provider Note   CSN: [id number] Arrival date & time: 09/26/20  1807     History Chief Complaint  Patient presents with  . Toe Pain    Albert Rogers is a 87 y.o. male.  Patient is a 87 year old male who presents with a blister on his toe.  He said he noticed a water blister on his toe about 3 days ago and since then it opened up and has gotten more red around the toe.  He denies any fevers.  He says it does not hurt.  He is a known diabetic.  He denies any known injuries to the area.        Past Medical History:  Diagnosis Date  . Diabetes ([place])   . Macular degeneration   . Prostate cancer [facility])     Patient Active Problem List   Diagnosis Date Noted  . AMS (altered mental status) 07/17/2020  . Acute metabolic encephalopathy 27/51/7001  . Type 2 diabetes mellitus without complication ([place]) 74/94/4967  . Dyslipidemia 07/16/2020  . Exudative senile macular degeneration of retina ([place]) 07/16/2020    History reviewed. No pertinent surgical history.     No family history on file.  Social History   Tobacco Use  . Smoking status: Former Research scientist (life sciences)  . Smokeless tobacco: Never Used  Substance Use Topics  . Alcohol use: Not Currently  . Drug use: Never    Home Medications Prior to Admission medications   Medication Sig Start Date End Date Taking? Authorizing Provider  doxycycline (VIBRAMYCIN) 100 MG capsule Take 1 capsule (100 mg total) by mouth 2 (two) times daily. One po bid x 7 days 09/26/20  Yes Malvin Johns, MD  amLODipine (NORVASC) 5 MG tablet Take 1 tablet (5 mg total) by mouth daily. 07/20/20   Caren Griffins, MD  metFORMIN (GLUCOPHAGE) 500 MG tablet Take 500 mg by mouth 2 (two) times daily. 07/01/20   [provider]  Multiple Vitamins-Minerals (OCUVITE ADULT 50+) CAPS Take 1 capsule by mouth daily.    [provider]  rosuvastatin (CRESTOR) 10 MG tablet Take 10 mg by mouth daily. 07/03/20    [provider]  VITAMIN D PO Take 1 capsule by mouth daily.    [provider]    Allergies    Codeine  Review of Systems   Review of Systems  Constitutional: Negative for fever.  Gastrointestinal: Negative for nausea and vomiting.  Musculoskeletal: Positive for joint swelling. Negative for arthralgias, back pain and neck pain.  Skin: Positive for wound.  Neurological: Negative for weakness, numbness and headaches.  All other systems reviewed and are negative.   Physical Exam Updated Vital Signs BP (!) 153/92   Pulse 84   Temp 98.3 F (36.8 C) (Oral)   Resp 18   Ht 5\' 11"  (1.803 m)   Wt 87.1 kg   SpO2 100%   BMI 26.78 kg/m   Physical Exam Constitutional:      Appearance: He is well-developed.  HENT:     Head: Normocephalic and atraumatic.  Cardiovascular:     Rate and Rhythm: Normal rate.  Pulmonary:     Effort: Pulmonary effort is normal.  Musculoskeletal:        General: No tenderness.     Cervical back: Normal range of motion and neck supple.     Comments: There is an open blister to the dorsal surface of his left great toe.  There is some redness to the toe  and some mild redness to the dorsum of the foot.  Pedal pulses are intact.  No tenderness is noted on palpation of the bone.  Skin:    General: Skin is warm and dry.  Neurological:     Mental Status: He is alert and oriented to person, place, and time.       ED Results / Procedures / Treatments   Labs (all labs ordered are listed, but only abnormal results are displayed) Labs Reviewed  CBC WITH DIFFERENTIAL/PLATELET - Abnormal; Notable for the following components:      Result Value   RBC 4.18 (*)    Hemoglobin 12.3 (*)    HCT 36.0 (*)    Platelets 129 (*)    All other components within normal limits  BASIC METABOLIC PANEL - Abnormal; Notable for the following components:   Sodium 133 (*)    Glucose, Bld 148 (*)    All other components within normal limits  LACTIC ACID,  PLASMA    EKG None  Radiology DG Toe Great Left  Result Date: 09/26/2020 CLINICAL DATA:  Diabetes mellitus with blistering and bleeding first digit EXAM: LEFT FIRST TOE: 3 V COMPARISON:  None. FINDINGS: Frontal, oblique, and lateral views obtained. There is soft tissue fullness with suspected mass medial to the first MTP joint. No soft tissue air or radiopaque foreign body in this masslike area. No fracture or dislocation. No bony destruction or erosion. Joint spaces appear normal. IMPRESSION: Apparent soft tissue mass medial to the first MTP joint measuring 1.6 x 1.0 cm. No associated soft tissue air. No fracture or dislocation. No bony destruction or erosion. Joint spaces unremarkable. Electronically Signed   By: Lowella Grip III M.D.   On: 09/26/2020 22:09    Procedures Procedures   Medications Ordered in ED Medications  doxycycline (VIBRA-TABS) tablet 100 mg (100 mg Oral Given 09/26/20 2226)    ED Course  I have reviewed the triage vital signs and the nursing notes.  Pertinent labs & imaging results that were available during my care of the patient were reviewed by me and considered in my medical decision making (see chart for details).    MDM Rules/Calculators/A&P                          Patient is a 87 year old male who presents with a wound to his left big toe.  It has a localized infection with a large callus under the wound and adjacent to the wound.  X-ray did not reveal any evidence of osteomyelitis.  His labs are nonconcerning.  There is no suggestions of sepsis or systemic infection.  We will start on doxycycline.  He will continue to use mupirocin cream that he has been using.  I stressed greatly that he should follow-up within the next 2 to 3 days for a wound check.  He will either follow-up with his PCP or his wife's podiatrist.  He was given strict return precautions. Final Clinical Impression(s) / ED Diagnoses Final diagnoses:  Ulcer of left foot, unspecified  ulcer stage ([place])    Rx / DC Orders ED Discharge Orders         Ordered    doxycycline (VIBRAMYCIN) 100 MG capsule  2 times daily        09/26/20 2241           Malvin Johns, MD 09/26/20 2243

## 2020-09-29 DIAGNOSIS — I10 Essential (primary) hypertension: Secondary | ICD-10-CM | POA: Diagnosis not present

## 2020-09-29 DIAGNOSIS — F0390 Unspecified dementia without behavioral disturbance: Secondary | ICD-10-CM | POA: Diagnosis not present

## 2020-09-29 DIAGNOSIS — E538 Deficiency of other specified B group vitamins: Secondary | ICD-10-CM | POA: Diagnosis not present

## 2020-09-29 DIAGNOSIS — E119 Type 2 diabetes mellitus without complications: Secondary | ICD-10-CM | POA: Diagnosis not present

## 2020-09-29 DIAGNOSIS — G9340 Encephalopathy, unspecified: Secondary | ICD-10-CM | POA: Diagnosis not present

## 2020-09-29 DIAGNOSIS — C61 Malignant neoplasm of prostate: Secondary | ICD-10-CM | POA: Diagnosis not present

## 2020-10-02 DIAGNOSIS — I10 Essential (primary) hypertension: Secondary | ICD-10-CM | POA: Diagnosis not present

## 2020-10-02 DIAGNOSIS — E538 Deficiency of other specified B group vitamins: Secondary | ICD-10-CM | POA: Diagnosis not present

## 2020-10-02 DIAGNOSIS — G9340 Encephalopathy, unspecified: Secondary | ICD-10-CM | POA: Diagnosis not present

## 2020-10-02 DIAGNOSIS — E119 Type 2 diabetes mellitus without complications: Secondary | ICD-10-CM | POA: Diagnosis not present

## 2020-10-02 DIAGNOSIS — C61 Malignant neoplasm of prostate: Secondary | ICD-10-CM | POA: Diagnosis not present

## 2020-10-02 DIAGNOSIS — F0390 Unspecified dementia without behavioral disturbance: Secondary | ICD-10-CM | POA: Diagnosis not present

## 2020-10-04 DIAGNOSIS — E538 Deficiency of other specified B group vitamins: Secondary | ICD-10-CM | POA: Diagnosis not present

## 2020-10-04 DIAGNOSIS — R4181 Age-related cognitive decline: Secondary | ICD-10-CM | POA: Diagnosis not present

## 2020-10-04 DIAGNOSIS — Z7984 Long term (current) use of oral hypoglycemic drugs: Secondary | ICD-10-CM | POA: Diagnosis not present

## 2020-10-04 DIAGNOSIS — Z9181 History of falling: Secondary | ICD-10-CM | POA: Diagnosis not present

## 2020-10-04 DIAGNOSIS — M5459 Other low back pain: Secondary | ICD-10-CM | POA: Diagnosis not present

## 2020-10-04 DIAGNOSIS — F0390 Unspecified dementia without behavioral disturbance: Secondary | ICD-10-CM | POA: Diagnosis not present

## 2020-10-04 DIAGNOSIS — M6281 Muscle weakness (generalized): Secondary | ICD-10-CM | POA: Diagnosis not present

## 2020-10-04 DIAGNOSIS — H353290 Exudative age-related macular degeneration, unspecified eye, stage unspecified: Secondary | ICD-10-CM | POA: Diagnosis not present

## 2020-10-04 DIAGNOSIS — E119 Type 2 diabetes mellitus without complications: Secondary | ICD-10-CM | POA: Diagnosis not present

## 2020-10-04 DIAGNOSIS — Z79899 Other long term (current) drug therapy: Secondary | ICD-10-CM | POA: Diagnosis not present

## 2020-10-04 DIAGNOSIS — E785 Hyperlipidemia, unspecified: Secondary | ICD-10-CM | POA: Diagnosis not present

## 2020-10-04 DIAGNOSIS — C61 Malignant neoplasm of prostate: Secondary | ICD-10-CM | POA: Diagnosis not present

## 2020-10-04 DIAGNOSIS — R278 Other lack of coordination: Secondary | ICD-10-CM | POA: Diagnosis not present

## 2020-10-04 DIAGNOSIS — I10 Essential (primary) hypertension: Secondary | ICD-10-CM | POA: Diagnosis not present

## 2020-10-04 DIAGNOSIS — R2681 Unsteadiness on feet: Secondary | ICD-10-CM | POA: Diagnosis not present

## 2020-10-04 DIAGNOSIS — G9340 Encephalopathy, unspecified: Secondary | ICD-10-CM | POA: Diagnosis not present

## 2020-10-06 ENCOUNTER — Ambulatory Visit (INDEPENDENT_AMBULATORY_CARE_PROVIDER_SITE_OTHER): Payer: Medicare Other | Admitting: Podiatry

## 2020-10-06 ENCOUNTER — Other Ambulatory Visit: Payer: Self-pay

## 2020-10-06 DIAGNOSIS — L97521 Non-pressure chronic ulcer of other part of left foot limited to breakdown of skin: Secondary | ICD-10-CM | POA: Diagnosis not present

## 2020-10-06 DIAGNOSIS — B351 Tinea unguium: Secondary | ICD-10-CM | POA: Diagnosis not present

## 2020-10-06 DIAGNOSIS — M79676 Pain in unspecified toe(s): Secondary | ICD-10-CM

## 2020-10-06 NOTE — Progress Notes (Signed)
  Subjective:  Patient ID: Albert Rogers, male    DOB: Jul 22, 1932,  MRN: [id number] HPI Chief Complaint  Patient presents with  . Diabetic Ulcer    Left great toe ulcer. Pt states the sore started as a water blister 10 days ago. Pt went to [place] on 5/21 where a xray was performed. No infection in bone. Pt states sore is not painful and believes it has stared to heal.  . Nail Problem    RFC nail trim requested bilateral nails 1-5.     87 y.o. male presents with the above complaint.   ROS: Denies fever chills nausea vomiting muscle aches pains calf pain back pain chest pain shortness of breath.  Past Medical History:  Diagnosis Date  . Diabetes ([place])   . Macular degeneration   . Prostate cancer ([place])    No past surgical history on file.  Current Outpatient Medications:  .  lisinopril (ZESTRIL) 5 MG tablet, 1 tablet, Disp: , Rfl:  .  amLODipine (NORVASC) 5 MG tablet, Take 1 tablet (5 mg total) by mouth daily., Disp: , Rfl:  .  doxycycline (VIBRAMYCIN) 100 MG capsule, Take 1 capsule (100 mg total) by mouth 2 (two) times daily. One po bid x 7 days, Disp: 14 capsule, Rfl: 0 .  metFORMIN (GLUCOPHAGE) 500 MG tablet, Take 500 mg by mouth 2 (two) times daily., Disp: , Rfl:  .  Multiple Vitamins-Minerals (OCUVITE ADULT 50+) CAPS, Take 1 capsule by mouth daily., Disp: , Rfl:  .  rosuvastatin (CRESTOR) 10 MG tablet, Take 10 mg by mouth daily., Disp: , Rfl:  .  VITAMIN D PO, Take 1 capsule by mouth daily., Disp: , Rfl:   Allergies  Allergen Reactions  . Codeine Other (See Comments)    Wife states pt hallucinates with codeine   Review of Systems Objective:  There were no vitals filed for this visit.  General: Well developed, nourished, in no acute distress, alert and oriented x3   Dermatological: Skin is warm, dry and supple bilateral. Nails x 10 are thick yellow dystrophic clinically mycotic.; remaining integument appears unremarkable at this time. There are no open sores, no  preulcerative lesions, no rash or signs of infection present.  Reactive hyperkeratosis to the plantar medial of the hallux interphalangeal joint allowed for bleeding beneath the lesion.  This was then pressed subcutaneously through to the dorsal aspect of the foot for erupted and has since dried.  I debrided the area of reactive hyperkeratosis today demonstrating some very superficial ulcerative lesion which should going to heal uneventfully.  Vascular: Dorsalis Pedis artery and Posterior Tibial artery pedal pulses are 2/4 bilateral with immedate capillary fill time. Pedal hair growth present. No varicosities and no lower extremity edema present bilateral.   Neruologic: Grossly intact via light touch bilateral. Vibratory intact via tuning fork bilateral. Protective threshold with Semmes Wienstein monofilament intact to all pedal sites bilateral. Patellar and Achilles deep tendon reflexes 2+ bilateral. No Babinski or clonus noted bilateral.   Musculoskeletal: No gross boney pedal deformities bilateral. No pain, crepitus, or limitation noted with foot and ankle range of motion bilateral. Muscular strength 5/5 in all groups tested bilateral.  Gait: Unassisted, Nonantalgic.    Radiographs:  None taken  Assessment & Plan:   Assessment: Abscess healing with ulceration hallux interphalangeal joint left.  Pain in limb onychomycosis.  Plan: Debrided reactive hyperkeratotic lesion and rated toenails 1 through 5 bilateral.     Albert Rogers T. [place], [place]

## 2020-10-08 DIAGNOSIS — C61 Malignant neoplasm of prostate: Secondary | ICD-10-CM | POA: Diagnosis not present

## 2020-10-08 DIAGNOSIS — F0390 Unspecified dementia without behavioral disturbance: Secondary | ICD-10-CM | POA: Diagnosis not present

## 2020-10-08 DIAGNOSIS — E538 Deficiency of other specified B group vitamins: Secondary | ICD-10-CM | POA: Diagnosis not present

## 2020-10-08 DIAGNOSIS — E119 Type 2 diabetes mellitus without complications: Secondary | ICD-10-CM | POA: Diagnosis not present

## 2020-10-08 DIAGNOSIS — I10 Essential (primary) hypertension: Secondary | ICD-10-CM | POA: Diagnosis not present

## 2020-10-08 DIAGNOSIS — G9340 Encephalopathy, unspecified: Secondary | ICD-10-CM | POA: Diagnosis not present

## 2020-11-02 DIAGNOSIS — H353211 Exudative age-related macular degeneration, right eye, with active choroidal neovascularization: Secondary | ICD-10-CM | POA: Diagnosis not present

## 2020-11-25 DIAGNOSIS — C61 Malignant neoplasm of prostate: Secondary | ICD-10-CM | POA: Diagnosis not present

## 2020-12-02 DIAGNOSIS — I10 Essential (primary) hypertension: Secondary | ICD-10-CM | POA: Diagnosis not present

## 2020-12-02 DIAGNOSIS — E7800 Pure hypercholesterolemia, unspecified: Secondary | ICD-10-CM | POA: Diagnosis not present

## 2020-12-02 DIAGNOSIS — M792 Neuralgia and neuritis, unspecified: Secondary | ICD-10-CM | POA: Diagnosis not present

## 2020-12-02 DIAGNOSIS — E1169 Type 2 diabetes mellitus with other specified complication: Secondary | ICD-10-CM | POA: Diagnosis not present

## 2020-12-02 DIAGNOSIS — H353221 Exudative age-related macular degeneration, left eye, with active choroidal neovascularization: Secondary | ICD-10-CM | POA: Diagnosis not present

## 2020-12-04 DIAGNOSIS — C61 Malignant neoplasm of prostate: Secondary | ICD-10-CM | POA: Diagnosis not present

## 2020-12-28 DIAGNOSIS — H353211 Exudative age-related macular degeneration, right eye, with active choroidal neovascularization: Secondary | ICD-10-CM | POA: Diagnosis not present

## 2021-01-08 DIAGNOSIS — M85851 Other specified disorders of bone density and structure, right thigh: Secondary | ICD-10-CM | POA: Diagnosis not present

## 2021-01-08 DIAGNOSIS — M8589 Other specified disorders of bone density and structure, multiple sites: Secondary | ICD-10-CM | POA: Diagnosis not present

## 2021-01-08 DIAGNOSIS — C61 Malignant neoplasm of prostate: Secondary | ICD-10-CM | POA: Diagnosis not present

## 2021-01-15 ENCOUNTER — Other Ambulatory Visit: Payer: Self-pay

## 2021-01-15 ENCOUNTER — Ambulatory Visit (INDEPENDENT_AMBULATORY_CARE_PROVIDER_SITE_OTHER): Payer: Medicare Other | Admitting: Podiatry

## 2021-01-15 ENCOUNTER — Encounter: Payer: Self-pay | Admitting: Podiatry

## 2021-01-15 DIAGNOSIS — B351 Tinea unguium: Secondary | ICD-10-CM

## 2021-01-15 DIAGNOSIS — M792 Neuralgia and neuritis, unspecified: Secondary | ICD-10-CM | POA: Insufficient documentation

## 2021-01-15 DIAGNOSIS — L84 Corns and callosities: Secondary | ICD-10-CM | POA: Diagnosis not present

## 2021-01-15 DIAGNOSIS — E119 Type 2 diabetes mellitus without complications: Secondary | ICD-10-CM | POA: Diagnosis not present

## 2021-01-15 DIAGNOSIS — M79676 Pain in unspecified toe(s): Secondary | ICD-10-CM | POA: Diagnosis not present

## 2021-01-15 DIAGNOSIS — I10 Essential (primary) hypertension: Secondary | ICD-10-CM | POA: Insufficient documentation

## 2021-01-15 DIAGNOSIS — E7800 Pure hypercholesterolemia, unspecified: Secondary | ICD-10-CM | POA: Insufficient documentation

## 2021-01-19 NOTE — Progress Notes (Signed)
  Subjective:  Patient ID: Albert Rogers, male    DOB: October 27, 1932,  MRN: [id number]  87 y.o. male presents with at risk foot care. Patient has history of diabetes with remote h/o diabetic ulceration left hallux. He was seen in local ED for cellulitis of left hallux and ulcer was discovered. He did follow up with Dr. Milinda Pointer in our clinic and ulcer has healed as of Albert Rogers's lastvisit. He also has painful thick toenails that are difficult to trim. Pain interferes with ambulation. Aggravating factors include wearing enclosed shoe gear. Pain is relieved with periodic professional debridement..    Patient's blood sugar was 140 mg/dl today.    His wife is present during today's visit.  PCP: Alroy Dust, L.Marlou Sa, MD and last visit was: 10/04/2020.  Review of Systems: Negative except as noted in the HPI.   Allergies  Allergen Reactions   Codeine Other (See Comments)    Wife states pt hallucinates with codeine Other reaction(s): Hallucinations    Objective:  There were no vitals filed for this visit. Constitutional Patient is a pleasant 87 y.o. Caucasian male WD, WN in NAD. AAO x 3.  Vascular Capillary fill time to digits <3 seconds b/l lower extremities. Palpable DP/PT pulse(s) b/l lower extremities. Pedal hair sparse b/l. Lower extremity skin temperature gradient within normal limits. No pain with calf compression b/l. No edema noted b/l lower extremities. No cyanosis or clubbing noted.  Neurologic Normal speech. Protective sensation intact 5/5 intact bilaterally with 10g monofilament b/l. Vibratory sensation intact b/l.  Dermatologic Pedal skin is warm and supple b/l lower extremities. No open wounds b/l lower extremities. No interdigital macerations b/l lower extremities. Toenails 1-5 b/l elongated, discolored, dystrophic, thickened, crumbly with subungual debris and tenderness to dorsal palpation. Preulcerative lesion medial hallux IPJ left foot with visible subdermal hemorrhage. No erythema, no  edema, no drainage, no fluctuance.   Orthopedic: Normal muscle strength 5/5 to all lower extremity muscle groups bilaterally. Patient ambulates independent of any assistive aids.   Hemoglobin A1C Latest Ref Rng & Units 07/17/2020  HGBA1C 4.8 - 5.6 % 6.8(H)  Some recent data might be hidden    Assessment:   1. Pain due to onychomycosis of toenail   2. Pre-ulcerative calluses   3. Type 2 diabetes mellitus without complication, without long-term current use of insulin ([place])    Plan:  Patient was evaluated and treated and all questions answered. Consent given for treatment as described below: -Examined patient. -Continue diabetic foot care principles: inspect feet daily, monitor glucose as recommended by PCP and/or Endocrinologist, and follow prescribed diet per PCP, Endocrinologist and/or dietician. -Patient to continue soft, supportive shoe gear daily. -Toenails 1-5 b/l were debrided in length and girth with sterile nail nippers and dremel without iatrogenic bleeding.  -Preulcerative lesion pared L hallux. Total number pared=1. -Patient to report any pedal injuries to medical professional immediately. -Dispensed digital toe cap for left hallux. Apply every morning. Remove every evening. -Patient/POA to call should there be question/concern in the interim.  Return in about 3 months (around 04/16/2021).  Marzetta Board, DPM

## 2021-02-22 DIAGNOSIS — H353211 Exudative age-related macular degeneration, right eye, with active choroidal neovascularization: Secondary | ICD-10-CM | POA: Diagnosis not present

## 2021-03-09 DIAGNOSIS — H353221 Exudative age-related macular degeneration, left eye, with active choroidal neovascularization: Secondary | ICD-10-CM | POA: Diagnosis not present

## 2021-04-07 DIAGNOSIS — C61 Malignant neoplasm of prostate: Secondary | ICD-10-CM | POA: Diagnosis not present

## 2021-04-14 DIAGNOSIS — Z5111 Encounter for antineoplastic chemotherapy: Secondary | ICD-10-CM | POA: Diagnosis not present

## 2021-04-14 DIAGNOSIS — M8580 Other specified disorders of bone density and structure, unspecified site: Secondary | ICD-10-CM | POA: Diagnosis not present

## 2021-04-14 DIAGNOSIS — C61 Malignant neoplasm of prostate: Secondary | ICD-10-CM | POA: Diagnosis not present

## 2021-04-20 DIAGNOSIS — H353211 Exudative age-related macular degeneration, right eye, with active choroidal neovascularization: Secondary | ICD-10-CM | POA: Diagnosis not present

## 2021-04-26 ENCOUNTER — Encounter: Payer: Self-pay | Admitting: Podiatry

## 2021-04-26 ENCOUNTER — Other Ambulatory Visit: Payer: Self-pay

## 2021-04-26 ENCOUNTER — Ambulatory Visit (INDEPENDENT_AMBULATORY_CARE_PROVIDER_SITE_OTHER): Payer: Medicare Other | Admitting: Podiatry

## 2021-04-26 DIAGNOSIS — E1142 Type 2 diabetes mellitus with diabetic polyneuropathy: Secondary | ICD-10-CM

## 2021-04-26 DIAGNOSIS — M2142 Flat foot [pes planus] (acquired), left foot: Secondary | ICD-10-CM

## 2021-04-26 DIAGNOSIS — L84 Corns and callosities: Secondary | ICD-10-CM | POA: Diagnosis not present

## 2021-04-26 DIAGNOSIS — B351 Tinea unguium: Secondary | ICD-10-CM | POA: Diagnosis not present

## 2021-04-26 DIAGNOSIS — M2141 Flat foot [pes planus] (acquired), right foot: Secondary | ICD-10-CM | POA: Diagnosis not present

## 2021-04-26 DIAGNOSIS — E119 Type 2 diabetes mellitus without complications: Secondary | ICD-10-CM

## 2021-04-26 DIAGNOSIS — C61 Malignant neoplasm of prostate: Secondary | ICD-10-CM | POA: Insufficient documentation

## 2021-04-26 DIAGNOSIS — M79676 Pain in unspecified toe(s): Secondary | ICD-10-CM

## 2021-04-26 NOTE — Progress Notes (Signed)
ANNUAL DIABETIC FOOT EXAM  Subjective: Albert Rogers presents today for for annual diabetic foot examination and painful elongated mycotic toenails 1-5 bilaterally which are tender when wearing enclosed shoe gear. Pain is relieved with periodic professional debridement.  Patient relates 72 year h/o diabetes.  Patient denies any h/o foot wounds.  Patient denies any numbness, tingling, burning, or pins/needle sensation in feet.  Patient's blood sugar was 138 mg/dl today.   Mitchell, L.Marlou Sa, MD is patient's PCP. Last visit was 10/04/2020.  Past Medical History:  Diagnosis Date   Diabetes ([place])    Macular degeneration    Prostate cancer [facility])    Patient Active Problem List   Diagnosis Date Noted   Prostate cancer ([place]) 04/26/2021   Benign essential hypertension 01/15/2021   Hypercholesterolemia 01/15/2021   Neuritis 01/15/2021   AMS (altered mental status) 09/32/6712   Acute metabolic encephalopathy 45/80/9983   Type 2 diabetes mellitus without complication ([place]) 38/25/0539   Dyslipidemia 07/16/2020   Exudative senile macular degeneration of retina ([place]) 07/16/2020   No past surgical history on file. Current Outpatient Medications on File Prior to Visit  Medication Sig Dispense Refill   Accu-Chek Softclix Lancets lancets Use 1 lancet as directed     amLODipine (NORVASC) 5 MG tablet Take 1 tablet (5 mg total) by mouth daily.     aspirin 325 MG tablet 1 tablet     Dextran 70-Hypromellose (ARTIFICIAL TEARS) 0.1-0.3 % SOLN See admin instructions.     doxycycline (VIBRAMYCIN) 100 MG capsule Take 1 capsule (100 mg total) by mouth 2 (two) times daily. One po bid x 7 days 14 capsule 0   glucose blood test strip Use to check blood sugars     glucose blood test strip Use 1 test strip to check blood sugars     lisinopril (ZESTRIL) 10 MG tablet Take 10 mg by mouth daily.     lisinopril (ZESTRIL) 5 MG tablet 1 tablet     metFORMIN (GLUCOPHAGE) 500 MG tablet Take 500 mg by mouth 2 (two)  times daily.     metFORMIN (GLUCOPHAGE) 500 MG tablet 1 tablet     Multiple Vitamins-Minerals (OCUVITE ADULT 50+) CAPS Take 1 capsule by mouth daily.     Multiple Vitamins-Minerals (OCUVITE PRESERVISION PO) See admin instructions.     rosuvastatin (CRESTOR) 10 MG tablet Take 10 mg by mouth daily.     VITAMIN D PO Take 1 capsule by mouth daily.     No current facility-administered medications on file prior to visit.    Allergies  Allergen Reactions   Codeine Other (See Comments)    Wife states pt hallucinates with codeine Other reaction(s): Hallucinations Other reaction(s): Hallucinations   Social History   Occupational History   Not on file  Tobacco Use   Smoking status: Former   Smokeless tobacco: Never  Substance and Sexual Activity   Alcohol use: Not Currently   Drug use: Never   Sexual activity: Not on file   No family history on file.  There is no immunization history on file for this patient.   Review of Systems: Negative except as noted in the HPI.   Objective: There were no vitals filed for this visit.  Albert Rogers is a pleasant 87 y.o. male in NAD. AAO X 3.  Vascular Examination: CFT <3 seconds b/l LE. Palpable DP pulse(s) b/l LE. Faintly palpable PT pulse(s) b/l LE. Pedal hair absent. No pain with calf compression b/l. Nonpitting edema noted right lower extremity. +1  pitting edema left lower extremity.  Dermatological Examination: Pedal integument with normal turgor, texture and tone BLE. No open wounds b/l LE. No interdigital macerations noted b/l LE. Toenails 1-5 b/l elongated, discolored, dystrophic, thickened, crumbly with subungual debris and tenderness to dorsal palpation. Hyperkeratotic lesion(s) R hallux and submet head 1 right foot.  No erythema, no edema, no drainage, no fluctuance.  Musculoskeletal Examination: Muscle strength 5/5 to all lower extremity muscle groups bilaterally. Pes planus deformity noted bilateral LE.  Footwear  Assessment: Does the patient wear appropriate shoes? Yes. Does the patient need inserts/orthotics? Yes.  Neurological Examination: Protective sensation diminished with 10g monofilament b/l. Vibratory sensation diminished b/l.  Hemoglobin A1C Latest Ref Rng & Units 07/17/2020  HGBA1C 4.8 - 5.6 % 6.8(H)  Some recent data might be hidden   Assessment: 1. Pain due to onychomycosis of toenail   2. Callus   3. Pes planus of both feet   4. Diabetic peripheral neuropathy associated with type 2 diabetes mellitus ([place])   5. Encounter for diabetic foot exam ([place])     ADA Risk Categorization: High Risk  Patient has one or more of the following: Loss of protective sensation Absent pedal pulses Severe Foot deformity History of foot ulcer  Plan: -Diabetic foot examination performed today. -Continue foot and shoe inspections daily. Monitor blood glucose per PCP/Endocrinologist's recommendations. -Mycotic toenails 1-5 bilaterally were debrided in length and girth with sterile nail nippers and dremel without incident. -Callus(es) R hallux and submet head 1 right foot pared utilizing sterile scalpel blade without complication or incident. Total number debrided =2. -Patient/POA to call should there be question/concern in the interim.  Return in about 3 months (around 07/25/2021).  Marzetta Board, DPM

## 2021-04-30 ENCOUNTER — Encounter: Payer: Self-pay | Admitting: Podiatry

## 2021-05-11 DIAGNOSIS — Z85828 Personal history of other malignant neoplasm of skin: Secondary | ICD-10-CM | POA: Diagnosis not present

## 2021-05-11 DIAGNOSIS — L570 Actinic keratosis: Secondary | ICD-10-CM | POA: Diagnosis not present

## 2021-05-11 DIAGNOSIS — C4442 Squamous cell carcinoma of skin of scalp and neck: Secondary | ICD-10-CM | POA: Diagnosis not present

## 2021-05-25 ENCOUNTER — Emergency Department (HOSPITAL_COMMUNITY): Payer: Medicare Other

## 2021-05-25 ENCOUNTER — Other Ambulatory Visit: Payer: Self-pay

## 2021-05-25 ENCOUNTER — Encounter (HOSPITAL_COMMUNITY): Payer: Self-pay

## 2021-05-25 ENCOUNTER — Inpatient Hospital Stay (HOSPITAL_COMMUNITY)
Admission: EM | Admit: 2021-05-25 | Discharge: 2021-05-28 | DRG: 492 | Disposition: A | Discharge status: Skilled Nursing Facility | Payer: Medicare Other | Attending: Family Medicine | Admitting: Family Medicine

## 2021-05-25 DIAGNOSIS — Z66 Do not resuscitate: Secondary | ICD-10-CM | POA: Diagnosis present

## 2021-05-25 DIAGNOSIS — S8001XA Contusion of right knee, initial encounter: Secondary | ICD-10-CM | POA: Diagnosis present

## 2021-05-25 DIAGNOSIS — I10 Essential (primary) hypertension: Secondary | ICD-10-CM | POA: Diagnosis present

## 2021-05-25 DIAGNOSIS — Z79899 Other long term (current) drug therapy: Secondary | ICD-10-CM | POA: Diagnosis not present

## 2021-05-25 DIAGNOSIS — R509 Fever, unspecified: Secondary | ICD-10-CM | POA: Diagnosis not present

## 2021-05-25 DIAGNOSIS — R5082 Postprocedural fever: Secondary | ICD-10-CM | POA: Diagnosis not present

## 2021-05-25 DIAGNOSIS — Z87891 Personal history of nicotine dependence: Secondary | ICD-10-CM

## 2021-05-25 DIAGNOSIS — R000 Tachycardia, unspecified: Secondary | ICD-10-CM | POA: Diagnosis not present

## 2021-05-25 DIAGNOSIS — E1140 Type 2 diabetes mellitus with diabetic neuropathy, unspecified: Secondary | ICD-10-CM | POA: Diagnosis present

## 2021-05-25 DIAGNOSIS — K229 Disease of esophagus, unspecified: Secondary | ICD-10-CM

## 2021-05-25 DIAGNOSIS — H9190 Unspecified hearing loss, unspecified ear: Secondary | ICD-10-CM | POA: Diagnosis present

## 2021-05-25 DIAGNOSIS — E119 Type 2 diabetes mellitus without complications: Secondary | ICD-10-CM

## 2021-05-25 DIAGNOSIS — M25572 Pain in left ankle and joints of left foot: Secondary | ICD-10-CM | POA: Diagnosis not present

## 2021-05-25 DIAGNOSIS — Z8546 Personal history of malignant neoplasm of prostate: Secondary | ICD-10-CM

## 2021-05-25 DIAGNOSIS — D696 Thrombocytopenia, unspecified: Secondary | ICD-10-CM | POA: Diagnosis present

## 2021-05-25 DIAGNOSIS — Y92017 Garden or yard in single-family (private) house as the place of occurrence of the external cause: Secondary | ICD-10-CM | POA: Diagnosis not present

## 2021-05-25 DIAGNOSIS — Y9301 Activity, walking, marching and hiking: Secondary | ICD-10-CM | POA: Diagnosis present

## 2021-05-25 DIAGNOSIS — M7989 Other specified soft tissue disorders: Secondary | ICD-10-CM | POA: Diagnosis not present

## 2021-05-25 DIAGNOSIS — S82852A Displaced trimalleolar fracture of left lower leg, initial encounter for closed fracture: Principal | ICD-10-CM | POA: Diagnosis present

## 2021-05-25 DIAGNOSIS — Z20822 Contact with and (suspected) exposure to covid-19: Secondary | ICD-10-CM | POA: Diagnosis present

## 2021-05-25 DIAGNOSIS — S8991XA Unspecified injury of right lower leg, initial encounter: Secondary | ICD-10-CM | POA: Diagnosis not present

## 2021-05-25 DIAGNOSIS — E7800 Pure hypercholesterolemia, unspecified: Secondary | ICD-10-CM | POA: Diagnosis present

## 2021-05-25 DIAGNOSIS — W000XXA Fall on same level due to ice and snow, initial encounter: Secondary | ICD-10-CM | POA: Diagnosis present

## 2021-05-25 DIAGNOSIS — S82302A Unspecified fracture of lower end of left tibia, initial encounter for closed fracture: Secondary | ICD-10-CM | POA: Diagnosis not present

## 2021-05-25 DIAGNOSIS — S82852D Displaced trimalleolar fracture of left lower leg, subsequent encounter for closed fracture with routine healing: Secondary | ICD-10-CM | POA: Diagnosis not present

## 2021-05-25 DIAGNOSIS — Z7984 Long term (current) use of oral hypoglycemic drugs: Secondary | ICD-10-CM | POA: Diagnosis not present

## 2021-05-25 DIAGNOSIS — S8252XA Displaced fracture of medial malleolus of left tibia, initial encounter for closed fracture: Secondary | ICD-10-CM | POA: Diagnosis not present

## 2021-05-25 DIAGNOSIS — D649 Anemia, unspecified: Secondary | ICD-10-CM | POA: Diagnosis present

## 2021-05-25 DIAGNOSIS — W19XXXD Unspecified fall, subsequent encounter: Secondary | ICD-10-CM | POA: Diagnosis not present

## 2021-05-25 DIAGNOSIS — E785 Hyperlipidemia, unspecified: Secondary | ICD-10-CM | POA: Diagnosis not present

## 2021-05-25 DIAGNOSIS — W19XXXA Unspecified fall, initial encounter: Secondary | ICD-10-CM

## 2021-05-25 DIAGNOSIS — S8262XA Displaced fracture of lateral malleolus of left fibula, initial encounter for closed fracture: Secondary | ICD-10-CM | POA: Diagnosis not present

## 2021-05-25 DIAGNOSIS — H3530 Unspecified macular degeneration: Secondary | ICD-10-CM | POA: Diagnosis present

## 2021-05-25 DIAGNOSIS — X501XXA Overexertion from prolonged static or awkward postures, initial encounter: Secondary | ICD-10-CM

## 2021-05-25 DIAGNOSIS — J690 Pneumonitis due to inhalation of food and vomit: Secondary | ICD-10-CM | POA: Diagnosis not present

## 2021-05-25 DIAGNOSIS — R6510 Systemic inflammatory response syndrome (SIRS) of non-infectious origin without acute organ dysfunction: Secondary | ICD-10-CM | POA: Diagnosis not present

## 2021-05-25 DIAGNOSIS — S82832A Other fracture of upper and lower end of left fibula, initial encounter for closed fracture: Secondary | ICD-10-CM | POA: Diagnosis not present

## 2021-05-25 DIAGNOSIS — R609 Edema, unspecified: Secondary | ICD-10-CM | POA: Diagnosis not present

## 2021-05-25 DIAGNOSIS — T148XXA Other injury of unspecified body region, initial encounter: Secondary | ICD-10-CM

## 2021-05-25 DIAGNOSIS — S82892A Other fracture of left lower leg, initial encounter for closed fracture: Secondary | ICD-10-CM

## 2021-05-25 DIAGNOSIS — Z743 Need for continuous supervision: Secondary | ICD-10-CM | POA: Diagnosis not present

## 2021-05-25 DIAGNOSIS — S93432A Sprain of tibiofibular ligament of left ankle, initial encounter: Secondary | ICD-10-CM | POA: Diagnosis not present

## 2021-05-25 DIAGNOSIS — G8918 Other acute postprocedural pain: Secondary | ICD-10-CM | POA: Diagnosis not present

## 2021-05-25 DIAGNOSIS — S82899A Other fracture of unspecified lower leg, initial encounter for closed fracture: Secondary | ICD-10-CM | POA: Diagnosis present

## 2021-05-25 DIAGNOSIS — Z885 Allergy status to narcotic agent status: Secondary | ICD-10-CM | POA: Diagnosis not present

## 2021-05-25 DIAGNOSIS — K449 Diaphragmatic hernia without obstruction or gangrene: Secondary | ICD-10-CM | POA: Diagnosis not present

## 2021-05-25 LAB — CBC WITH DIFFERENTIAL/PLATELET
Abs Immature Granulocytes: 0.03 10*3/uL (ref 0.00–0.07)
Basophils Absolute: 0 10*3/uL (ref 0.0–0.1)
Basophils Relative: 0 %
Eosinophils Absolute: 0 10*3/uL (ref 0.0–0.5)
Eosinophils Relative: 0 %
HCT: 38 % — ABNORMAL LOW (ref 39.0–52.0)
Hemoglobin: 12.2 g/dL — ABNORMAL LOW (ref 13.0–17.0)
Immature Granulocytes: 0 %
Lymphocytes Relative: 11 %
Lymphs Abs: 0.7 10*3/uL (ref 0.7–4.0)
MCH: 28.8 pg (ref 26.0–34.0)
MCHC: 32.1 g/dL (ref 30.0–36.0)
MCV: 89.8 fL (ref 80.0–100.0)
Monocytes Absolute: 0.5 10*3/uL (ref 0.1–1.0)
Monocytes Relative: 7 %
Neutro Abs: 5.5 10*3/uL (ref 1.7–7.7)
Neutrophils Relative %: 82 %
Platelets: 131 10*3/uL — ABNORMAL LOW (ref 150–400)
RBC: 4.23 MIL/uL (ref 4.22–5.81)
RDW: 14.9 % (ref 11.5–15.5)
WBC: 6.9 10*3/uL (ref 4.0–10.5)
nRBC: 0 % (ref 0.0–0.2)

## 2021-05-25 LAB — URINALYSIS, ROUTINE W REFLEX MICROSCOPIC
Bacteria, UA: NONE SEEN
Bilirubin Urine: NEGATIVE
Glucose, UA: NEGATIVE mg/dL
Hgb urine dipstick: NEGATIVE
Ketones, ur: NEGATIVE mg/dL
Leukocytes,Ua: NEGATIVE
Nitrite: NEGATIVE
Protein, ur: 100 mg/dL — AB
Specific Gravity, Urine: 1.01 (ref 1.005–1.030)
pH: 5 (ref 5.0–8.0)

## 2021-05-25 LAB — BASIC METABOLIC PANEL
Anion gap: 7 (ref 5–15)
BUN: 15 mg/dL (ref 8–23)
CO2: 27 mmol/L (ref 22–32)
Calcium: 8.6 mg/dL — ABNORMAL LOW (ref 8.9–10.3)
Chloride: 102 mmol/L (ref 98–111)
Creatinine, Ser: 0.67 mg/dL (ref 0.61–1.24)
GFR, Estimated: >60 mL/min (ref >60)
Glucose, Bld: 143 mg/dL — ABNORMAL HIGH (ref 70–99)
Potassium: 3.5 mmol/L (ref 3.5–5.1)
Sodium: 136 mmol/L (ref 135–145)

## 2021-05-25 LAB — PROTIME-INR
INR: 1 (ref 0.8–1.2)
Prothrombin Time: 13.4 seconds (ref 11.4–15.2)

## 2021-05-25 LAB — RESP PANEL BY RT-PCR (FLU A&B, COVID) ARPGX2
Influenza A by PCR: NEGATIVE
Influenza B by PCR: NEGATIVE
SARS Coronavirus 2 by RT PCR: NEGATIVE

## 2021-05-25 LAB — GLUCOSE, CAPILLARY
Glucose-Capillary: 128 mg/dL — ABNORMAL HIGH (ref 70–99)
Glucose-Capillary: 110 mg/dL — ABNORMAL HIGH (ref 70–99)

## 2021-05-25 LAB — TYPE AND SCREEN
ABO/RH(D): O POS
Antibody Screen: NEGATIVE

## 2021-05-25 LAB — ABO/RH: ABO/RH(D): O POS

## 2021-05-25 LAB — HEMOGLOBIN A1C
Hgb A1c MFr Bld: 6.4 % — ABNORMAL HIGH (ref 4.8–5.6)
Mean Plasma Glucose: 136.98 mg/dL

## 2021-05-25 MED ORDER — MORPHINE SULFATE (PF) 4 MG/ML IV SOLN
4.0000 mg | Freq: Once | INTRAVENOUS | Status: AC
  Administered 2021-05-25: 4 mg via INTRAVENOUS
  Filled 2021-05-25: qty 1

## 2021-05-25 MED ORDER — OCUVITE ADULT 50+ PO CAPS: 1.0000 | ORAL_CAPSULE | Freq: Every day | ORAL | Status: DC

## 2021-05-25 MED ORDER — MORPHINE SULFATE (PF) 4 MG/ML IV SOLN: 4.0000 mg | INTRAVENOUS | Status: DC | PRN

## 2021-05-25 MED ORDER — PROSIGHT PO TABS
1.0000 | ORAL_TABLET | Freq: Every day | ORAL | Status: DC
  Administered 2021-05-27 – 2021-05-28 (×2): 1 via ORAL
  Filled 2021-05-25 (×2): qty 1

## 2021-05-25 MED ORDER — CEFAZOLIN SODIUM-DEXTROSE 2-4 GM/100ML-% IV SOLN
2.0000 g | INTRAVENOUS | Status: AC
  Administered 2021-05-26: 2 g via INTRAVENOUS
  Filled 2021-05-25: qty 100

## 2021-05-25 MED ORDER — INSULIN ASPART 100 UNIT/ML IJ SOLN
0.0000 [IU] | Freq: Three times a day (TID) | INTRAMUSCULAR | Status: DC
  Administered 2021-05-25 – 2021-05-28 (×5): 2 [IU] via SUBCUTANEOUS
  Administered 2021-05-28: 3 [IU] via SUBCUTANEOUS

## 2021-05-25 MED ORDER — ONDANSETRON HCL 4 MG/2ML IJ SOLN
4.0000 mg | Freq: Once | INTRAMUSCULAR | Status: AC
  Administered 2021-05-25: 4 mg via INTRAVENOUS
  Filled 2021-05-25: qty 2

## 2021-05-25 MED ORDER — SODIUM CHLORIDE 0.9 % IV SOLN: INTRAVENOUS | Status: DC

## 2021-05-25 MED ORDER — ENOXAPARIN SODIUM 40 MG/0.4ML IJ SOSY
40.0000 mg | PREFILLED_SYRINGE | INTRAMUSCULAR | Status: DC
  Administered 2021-05-26 – 2021-05-27 (×2): 40 mg via SUBCUTANEOUS
  Filled 2021-05-25 (×2): qty 0.4

## 2021-05-25 MED ORDER — ONDANSETRON HCL 4 MG/2ML IJ SOLN: 4.0000 mg | Freq: Four times a day (QID) | INTRAMUSCULAR | Status: DC | PRN

## 2021-05-25 MED ORDER — INSULIN ASPART 100 UNIT/ML IJ SOLN: 0.0000 [IU] | Freq: Every day | INTRAMUSCULAR | Status: DC

## 2021-05-25 MED ORDER — ACETAMINOPHEN 325 MG PO TABS
650.0000 mg | ORAL_TABLET | Freq: Four times a day (QID) | ORAL | Status: DC | PRN
  Administered 2021-05-25 – 2021-05-28 (×4): 650 mg via ORAL
  Filled 2021-05-25 (×4): qty 2

## 2021-05-25 MED ORDER — ONDANSETRON HCL 4 MG PO TABS: 4.0000 mg | ORAL_TABLET | Freq: Four times a day (QID) | ORAL | Status: DC | PRN

## 2021-05-25 MED ORDER — LISINOPRIL 10 MG PO TABS
10.0000 mg | ORAL_TABLET | Freq: Every day | ORAL | Status: DC
  Administered 2021-05-27 – 2021-05-28 (×2): 10 mg via ORAL
  Filled 2021-05-25 (×2): qty 1

## 2021-05-25 MED ORDER — CHLORHEXIDINE GLUCONATE 4 % EX LIQD: 60.0000 mL | Freq: Once | CUTANEOUS | Status: DC

## 2021-05-25 MED ORDER — FENTANYL CITRATE PF 50 MCG/ML IJ SOSY
12.5000 ug | PREFILLED_SYRINGE | INTRAMUSCULAR | Status: DC | PRN
  Administered 2021-05-27 (×4): 12.5 ug via INTRAVENOUS
  Filled 2021-05-25 (×4): qty 1

## 2021-05-25 MED ORDER — ROSUVASTATIN CALCIUM 10 MG PO TABS: 10.0000 mg | ORAL_TABLET | ORAL | Status: DC

## 2021-05-25 MED ORDER — SODIUM CHLORIDE 0.9 % IV BOLUS
1000.0000 mL | Freq: Once | INTRAVENOUS | Status: AC
  Administered 2021-05-25: 1000 mL via INTRAVENOUS

## 2021-05-25 MED ORDER — POVIDONE-IODINE 10 % EX SWAB: 2.0000 "application " | Freq: Once | CUTANEOUS | Status: DC

## 2021-05-25 MED ORDER — ACETAMINOPHEN 650 MG RE SUPP: 650.0000 mg | Freq: Four times a day (QID) | RECTAL | Status: DC | PRN

## 2021-05-25 NOTE — Consult Note (Signed)
Reason for Consult: Left ankle pain Referring Physician: Dr. Sara Chu Kwadwo Albert Rogers is an 88 y.o. male.  HPI: 88 year old male with past medical history significant for diabetes complains of left ankle pain since a fall this morning.  He was walking down his ramp and slipped on the frost.  He denies pain anywhere besides the left ankle.  He crawled back up the ramp and sat until EMS came.  He was brought to the emergency room at [facility].  Radiographs reveal a comminuted trimalleolar ankle fracture.  He underwent closed reduction and splinting in the emergency room.  He is admitted to the hospitalist service pending surgical treatment of this displaced and unstable left ankle injury.  He does not take any blood thinners.  His daughter is with him and provides additional history today.  He lives at home with his wife.  He is ambulatory around his house at baseline without any assistive devices.  He denies any pain in the left ankle at rest.  He complains of sharp pain when he moves his ankle.  He is not a smoker.  COVID test is negative on this admission.  Past Medical History:  Diagnosis Date   Diabetes ([place])    Macular degeneration    Prostate cancer ([place])     History reviewed. No pertinent surgical history.  History reviewed. No pertinent family history.  Social History:  reports that he has quit smoking. He has never used smokeless tobacco. He reports that he does not currently use alcohol. He reports that he does not use drugs.  Allergies:  Allergies  Allergen Reactions   Codeine Other (See Comments)    Wife states pt hallucinates with codeine Other reaction(s): Hallucinations Other reaction(s): Hallucinations    Medications: I have reviewed the patient's current medications.  Results for orders placed or performed during the hospital encounter of 05/25/21 (from the past 48 hour(s))  Type and screen [place]     Status: None   Collection Time: 05/25/21  12:18 PM  Result Value Ref Range   ABO/RH(D) O POS    Antibody Screen NEG    Sample Expiration      05/28/2021,2359 Performed at [facility], [place] [address] [place], [place] [address]   Basic metabolic panel     Status: Abnormal   Collection Time: 05/25/21 12:22 PM  Result Value Ref Range   Sodium 136 135 - 145 mmol/L   Potassium 3.5 3.5 - 5.1 mmol/L   Chloride 102 98 - 111 mmol/L   CO2 27 22 - 32 mmol/L   Glucose, Bld 143 (H) 70 - 99 mg/dL    Comment: Glucose reference range applies only to samples taken after fasting for at least 8 hours.   BUN 15 8 - 23 mg/dL   Creatinine, Ser 0.67 0.61 - 1.24 mg/dL   Calcium 8.6 (L) 8.9 - 10.3 mg/dL   GFR, Estimated >60 >60 mL/min    Comment: (NOTE) Calculated using the CKD-EPI Creatinine Equation (2021)    Anion gap 7 5 - 15    Comment: Performed at [facility], [place] [address], [place], [place] [address]  CBC WITH DIFFERENTIAL     Status: Abnormal   Collection Time: 05/25/21 12:22 PM  Result Value Ref Range   WBC 6.9 4.0 - 10.5 K/uL   RBC 4.23 4.22 - 5.81 MIL/uL   Hemoglobin 12.2 (L) 13.0 - 17.0 g/dL   HCT 38.0 (L) 39.0 - 52.0 %  MCV 89.8 80.0 - 100.0 fL   MCH 28.8 26.0 - 34.0 pg   MCHC 32.1 30.0 - 36.0 g/dL   RDW 14.9 11.5 - 15.5 %   Platelets 131 (L) 150 - 400 K/uL    Comment: Immature Platelet Fraction may be clinically indicated, consider ordering this additional test [id number]    nRBC 0.0 0.0 - 0.2 %   Neutrophils Relative % 82 %   Neutro Abs 5.5 1.7 - 7.7 K/uL   Lymphocytes Relative 11 %   Lymphs Abs 0.7 0.7 - 4.0 K/uL   Monocytes Relative 7 %   Monocytes Absolute 0.5 0.1 - 1.0 K/uL   Eosinophils Relative 0 %   Eosinophils Absolute 0.0 0.0 - 0.5 K/uL   Basophils Relative 0 %   Basophils Absolute 0.0 0.0 - 0.1 K/uL   Immature Granulocytes 0 %   Abs Immature Granulocytes 0.03 0.00 - 0.07 K/uL    Comment: Performed at [facility], [place] [address],  [place], [place] [address]  Urinalysis, Routine w reflex microscopic Urine, Clean Catch     Status: Abnormal   Collection Time: 05/25/21 12:22 PM  Result Value Ref Range   Color, Urine YELLOW YELLOW   APPearance CLEAR CLEAR   Specific Gravity, Urine 1.010 1.005 - 1.030   pH 5.0 5.0 - 8.0   Glucose, UA NEGATIVE NEGATIVE mg/dL   Hgb urine dipstick NEGATIVE NEGATIVE   Bilirubin Urine NEGATIVE NEGATIVE   Ketones, ur NEGATIVE NEGATIVE mg/dL   Protein, ur 100 (A) NEGATIVE mg/dL   Nitrite NEGATIVE NEGATIVE   Leukocytes,Ua NEGATIVE NEGATIVE   RBC / HPF 0-5 0 - 5 RBC/hpf   WBC, UA 0-5 0 - 5 WBC/hpf   Bacteria, UA NONE SEEN NONE SEEN    Comment: Performed at [facility], [place] [address], [place], [place] [address]  Resp Panel by RT-PCR (Flu A&B, Covid) Nasopharyngeal Swab     Status: None   Collection Time: 05/25/21 12:22 PM   Specimen: Nasopharyngeal Swab; Nasopharyngeal(NP) swabs in vial transport medium  Result Value Ref Range   SARS Coronavirus 2 by RT PCR NEGATIVE NEGATIVE    Comment: (NOTE) SARS-CoV-2 target nucleic acids are NOT DETECTED.  The SARS-CoV-2 RNA is generally detectable in upper respiratory specimens during the acute phase of infection. The lowest concentration of SARS-CoV-2 viral copies this assay can detect is 138 copies/mL. A negative result does not preclude SARS-Cov-2 infection and should not be used as the sole basis for treatment or other patient management decisions. A negative result may occur with  improper specimen collection/handling, submission of specimen other than nasopharyngeal swab, presence of viral mutation(s) within the areas targeted by this assay, and inadequate number of viral copies(<138 copies/mL). A negative result must be combined with clinical observations, patient history, and epidemiological information. The expected result is Negative.  Fact Sheet for Patients:  EntrepreneurPulse.com.au  Fact Sheet for  Healthcare Providers:  IncredibleEmployment.be  This test is no t yet approved or cleared by the [place] FDA and  has been authorized for detection and/or diagnosis of SARS-CoV-2 by FDA under an Emergency Use Authorization (EUA). This EUA will remain  in effect (meaning this test can be used) for the duration of the COVID-19 declaration under Section 564(b)(1) of the Act, 21 U.S.C.section 360bbb-3(b)(1), unless the authorization is terminated  or revoked sooner.       Influenza A by PCR NEGATIVE NEGATIVE   Influenza B by PCR NEGATIVE NEGATIVE    Comment: (  NOTE) The Xpert Xpress SARS-CoV-2/FLU/RSV plus assay is intended as an aid in the diagnosis of influenza from Nasopharyngeal swab specimens and should not be used as a sole basis for treatment. Nasal washings and aspirates are unacceptable for Xpert Xpress SARS-CoV-2/FLU/RSV testing.  Fact Sheet for Patients: EntrepreneurPulse.com.au  Fact Sheet for Healthcare Providers: IncredibleEmployment.be  This test is not yet approved or cleared by the [place] FDA and has been authorized for detection and/or diagnosis of SARS-CoV-2 by FDA under an Emergency Use Authorization (EUA). This EUA will remain in effect (meaning this test can be used) for the duration of the COVID-19 declaration under Section 564(b)(1) of the Act, 21 U.S.C. section 360bbb-3(b)(1), unless the authorization is terminated or revoked.  Performed at [facility], [place] [address] [place], [place] [address]   Protime-INR     Status: None   Collection Time: 05/25/21  3:12 PM  Result Value Ref Range   Prothrombin Time 13.4 11.4 - 15.2 seconds   INR 1.0 0.8 - 1.2    Comment: (NOTE) INR goal varies based on device and disease states. Performed at [facility], [place] [address] [place], [place] [address]     DG Tibia/Fibula Left  Result Date:  05/25/2021 CLINICAL DATA:  Trauma, fall EXAM: LEFT TIBIA AND FIBULA - 2 VIEW COMPARISON:  None. FINDINGS: There is fracture in the base of medial malleolus extending to the posterior aspect of distal tibia. There is slightly displaced fracture in the distal metaphyseal region of fibula. There is widening of joint space in the medial and anterior aspects of the ankle suggesting subluxation. Scattered arterial calcifications are seen in the soft tissues. There is soft tissue swelling around the ankle. IMPRESSION: Slightly displaced fractures are seen in the distal left tibia and fibula. Subluxation is noted in the left ankle joint. Electronically Signed   By: Elmer Picker M.D.   On: 05/25/2021 11:02   DG Ankle 2 Views Left  Result Date: 05/25/2021 CLINICAL DATA:  Ankle fracture.  Status post reduction. EXAM: LEFT ANKLE - 2 VIEW COMPARISON:  Two-view tibia and fibular radiographs 05/25/2021. CT of the ankle 05/25/2021. FINDINGS: The ankle is splinted. Displaced posterior tibial fracture unchanged. Displaced distal fibular fracture is unchanged. Ankle joint remains displaced medially. IMPRESSION: 1. Stable appearance of trimalleolar ankle fracture. 2. Interval placement of fiberglass splint. Electronically Signed   By: [place] Morelle M.D.   On: 05/25/2021 12:50   DG Ankle Complete Left  Result Date: 05/25/2021 CLINICAL DATA:  Trauma, pain EXAM: LEFT ANKLE COMPLETE - 3+ VIEW COMPARISON:  None. FINDINGS: There is recent oblique fracture in the distal metaphysis of fibula. There is posterior and lateral displacement of distal major fracture fragment. There is comminuted fracture in the base of medial malleolus extending to the distal metaphyseal region of tibia. Fracture line involves articular surface. There is widening of joint space in the anterior and medial aspect of the ankle mortise. IMPRESSION: Slightly displaced fractures are seen in the distal left tibia and fibula. There is subluxation in  the ankle joint. Electronically Signed   By: Elmer Picker M.D.   On: 05/25/2021 11:01   CT Ankle Left Wo Contrast  Result Date: 05/25/2021 CLINICAL DATA:  Ankle trauma, fracture. EXAM: CT OF THE LEFT ANKLE WITHOUT CONTRAST TECHNIQUE: Multidetector CT imaging of the left ankle was performed according to the standard protocol. Multiplanar CT image reconstructions were also generated. COMPARISON:  Radiographs dated May 25, 2021 FINDINGS: Bones/Joint/Cartilage Posteriorly displaced fracture of  the distal fibula with approximately 6 mm posterior displacement of the distal fracture fragment. Mildly displaced fracture of the posterior malleolus with approximately 2 mm posterior displacement. There is mildly displaced fracture of the medial malleolus. There is lateral displacement at the tibiotalar joint. Subtalar joint is intact. The talonavicular joint is maintained. Ligaments Suboptimally assessed by CT. Muscles and Tendons Soft tissue swelling about the ankle. No evidence of tendon entrapment. Soft tissues Prominent subcutaneous soft tissue edema about the ankle. IMPRESSION: Trimalleolar fracture with dislocation at the tibiotalar joint. No evidence of tendon entrapment or intra-articular osseous fragment. Electronically Signed   By: Keane Police D.O.   On: 05/25/2021 13:42   DG Knee Complete 4 Views Right  Result Date: 05/25/2021 CLINICAL DATA:  Trauma, fall EXAM: RIGHT KNEE - COMPLETE 4+ VIEW COMPARISON:  None. FINDINGS: No recent fracture or dislocation is seen. There is no significant effusion in the suprapatellar bursa. Bony spurs seen in patella. There is minimal undulation in the articular surface of patella without definite break in the cortical margins. This may be normal variation or residual change from previous trauma. There is joint space narrowing in the medial compartment. Arterial calcifications are seen in the soft tissues. IMPRESSION: No recent displaced fracture or dislocation is  seen in the right knee. There is no significant effusion. Degenerative changes are noted with bony spurs in the the patellofemoral compartment. Electronically Signed   By: Elmer Picker M.D.   On: 05/25/2021 11:04    ROS: No recent fever, chills, nausea, vomiting or changes in his appetite.  10 system review was otherwise negative. PE:  Blood pressure (!) 122/98, pulse (!) 111, temperature (!) 97.5 F (36.4 C), temperature source Oral, resp. rate 16, height 5\' 10"  (1.778 m), weight 87.5 kg, SpO2 98 %. Well-nourished well-developed elderly man in no apparent distress.  Alert and oriented.  Extraocular motions are intact.  Respirations are labored.  He is extremely hard of hearing.  Dentition is poor.  Left ankle is splinted.  Brisk capillary refill at the toes.  Intact sensibility to light touch dorsally and plantarly at the forefoot.  5 out of 5 strength in plantarflexion and dorsiflexion of the toes.  Moderate swelling about the foot.  No lymphadenopathy proximal to the splint.  Assessment/Plan: Comminuted left ankle trimalleolar fracture -to the operating room tomorrow with Dr. Kathaleen Bury for open treatment with internal fixation.  The patient and his daughter understand the risks and benefits of the alternative treatment options and elect surgical treatment.  They specifically understand risk of bleeding, infection, nerve damage, blood clots, continued pain, need for additional surgery, posttraumatic arthritis, amputation and death.  N.p.o. after midnight.  Hold blood thinners.  Wylene Simmer 05/25/2021, 3:47 PM

## 2021-05-25 NOTE — H&P (Signed)
History and Physical    Mauri Tolen ZOX:[id number] DOB: [birth year] DOA: 05/25/2021  PCP: Alroy Dust, L.Marlou Sa, MD  Patient coming from: Home  Chief Complaint: left ankle pain  HPI: Albert Rogers is a 88 y.o. male with medical history significant of DM2, HLD, HTN. Presenting with left ankle pain. He report that he was walking out from his house down a ramp. It was slick and so he slipped. He felt to his left and rolled his ankle. He did not hit his head. He remembers the entire fall. There was no LOC. He tried to get up, but he couldn't put weight on his left foot. He crawled to the base of the ramp and called for EMS. He denies any other aggravating or alleviating factors.   ED Course: He was noted to have a left trimalleolar ankle fracture. He was placed in a splint. Orthopedics was consulted. TR[facility] was called for admission.   Review of Systems:  Denies CP, palpitations, dyspnea, abdominal, N/V/D, fever. Review of systems is otherwise negative for all not mentioned in HPI.   PMHx Past Medical History:  Diagnosis Date   Diabetes ([place])    Macular degeneration    Prostate cancer ([place])     PSHx History reviewed. No pertinent surgical history.  SocHx  reports that he has quit smoking. He has never used smokeless tobacco. He reports that he does not currently use alcohol. He reports that he does not use drugs.  Allergies  Allergen Reactions   Codeine Other (See Comments)    Wife states pt hallucinates with codeine Other reaction(s): Hallucinations Other reaction(s): Hallucinations    FamHx History reviewed. No pertinent family history.  Prior to Admission medications   Medication Sig Start Date End Date Taking? Authorizing Provider  lisinopril (ZESTRIL) 10 MG tablet Take 10 mg by mouth daily. 11/06/20  Yes [provider]  metFORMIN (GLUCOPHAGE) 500 MG tablet Take 500 mg by mouth 2 (two) times daily. 07/01/20  Yes [provider]  Multiple Vitamins-Minerals  (OCUVITE ADULT 50+) CAPS Take 1 capsule by mouth daily.   Yes [provider]  rosuvastatin (CRESTOR) 10 MG tablet Take 10 mg by mouth 3 (three) times a week. Monday Wednesday and Friday 07/03/20  Yes [provider]  VITAMIN D PO Take 1 capsule by mouth daily.   Yes [provider]  Accu-Chek Softclix Lancets lancets Use 1 lancet as directed 05/25/17   [provider]  amLODipine (NORVASC) 5 MG tablet Take 1 tablet (5 mg total) by mouth daily. Patient not taking: Reported on 05/25/2021 07/20/20   Caren Griffins, MD  glucose blood test strip Use to check blood sugars 05/05/17   [provider]  glucose blood test strip Use 1 test strip to check blood sugars 02/12/18   [provider]    Physical Exam: Vitals:   05/25/21 1026 05/25/21 1223  BP: 129/85 (!) 126/101  Pulse: (!) 110 91  Resp: 16 16  Temp: (!) 97.5 F (36.4 C)   TempSrc: Oral   SpO2: 97% 98%  Weight: 87.5 kg   Height: 5\' 10"  (1.778 m)     General: 88 y.o. male resting in bed in NAD Eyes: PERRL, normal sclera ENMT: Nares patent w/o discharge, orophaynx clear, dentition normal, ears w/o discharge/lesions/ulcers Neck: Supple, trachea midline Cardiovascular: RRR, +S1, S2, no m/g/r, equal pulses throughout Respiratory: CTABL, no w/r/r, normal WOB GI: BS+, NDNT, no masses noted, no organomegaly noted MSK: No e/c/c; LLE in fiberglass splint  Neuro: A&O x 3, other than hard of hearing, no focal deficits Psyc: Appropriate interaction and affect, calm/cooperative  Labs on Admission: I have personally reviewed following labs and imaging studies  CBC: Recent Labs  Lab 05/25/21 1222  WBC 6.9  NEUTROABS 5.5  HGB 12.2*  HCT 38.0*  MCV 89.8  PLT 295*   Basic Metabolic Panel: Recent Labs  Lab 05/25/21 1222  NA 136  K 3.5  CL 102  CO2 27  GLUCOSE 143*  BUN 15  CREATININE 0.67  CALCIUM 8.6*   GFR: Estimated Creatinine Clearance: 68.4 mL/min (by C-G formula based  on SCr of 0.67 mg/dL). Liver Function Tests: No results for input(s): AST, ALT, ALKPHOS, BILITOT, PROT, ALBUMIN in the last 168 hours. No results for input(s): LIPASE, AMYLASE in the last 168 hours. No results for input(s): AMMONIA in the last 168 hours. Coagulation Profile: No results for input(s): INR, PROTIME in the last 168 hours. Cardiac Enzymes: No results for input(s): CKTOTAL, CKMB, CKMBINDEX, TROPONINI in the last 168 hours. BNP (last 3 results) No results for input(s): PROBNP in the last 8760 hours. HbA1C: No results for input(s): HGBA1C in the last 72 hours. CBG: No results for input(s): GLUCAP in the last 168 hours. Lipid Profile: No results for input(s): CHOL, HDL, LDLCALC, TRIG, CHOLHDL, LDLDIRECT in the last 72 hours. Thyroid Function Tests: No results for input(s): TSH, T4TOTAL, FREET4, T3FREE, THYROIDAB in the last 72 hours. Anemia Panel: No results for input(s): VITAMINB12, FOLATE, FERRITIN, TIBC, IRON, RETICCTPCT in the last 72 hours. Urine analysis:    Component Value Date/Time   COLORURINE YELLOW 05/25/2021 [place] 05/25/2021 1222   LABSPEC 1.010 05/25/2021 1222   PHURINE 5.0 05/25/2021 1222   GLUCOSEU NEGATIVE 05/25/2021 1222   HGBUR NEGATIVE 05/25/2021 1222   BILIRUBINUR NEGATIVE 05/25/2021 1222   KETONESUR NEGATIVE 05/25/2021 1222   PROTEINUR 100 (A) 05/25/2021 1222   UROBILINOGEN 0.2 03/05/2007 1537   NITRITE NEGATIVE 05/25/2021 1222   LEUKOCYTESUR NEGATIVE 05/25/2021 1222    Radiological Exams on Admission: DG Tibia/Fibula Left  Result Date: 05/25/2021 CLINICAL DATA:  Trauma, fall EXAM: LEFT TIBIA AND FIBULA - 2 VIEW COMPARISON:  None. FINDINGS: There is fracture in the base of medial malleolus extending to the posterior aspect of distal tibia. There is slightly displaced fracture in the distal metaphyseal region of fibula. There is widening of joint space in the medial and anterior aspects of the ankle suggesting subluxation.  Scattered arterial calcifications are seen in the soft tissues. There is soft tissue swelling around the ankle. IMPRESSION: Slightly displaced fractures are seen in the distal left tibia and fibula. Subluxation is noted in the left ankle joint. Electronically Signed   By: Elmer Picker M.D.   On: 05/25/2021 11:02   DG Ankle 2 Views Left  Result Date: 05/25/2021 CLINICAL DATA:  Ankle fracture.  Status post reduction. EXAM: LEFT ANKLE - 2 VIEW COMPARISON:  Two-view tibia and fibular radiographs 05/25/2021. CT of the ankle 05/25/2021. FINDINGS: The ankle is splinted. Displaced posterior tibial fracture unchanged. Displaced distal fibular fracture is unchanged. Ankle joint remains displaced medially. IMPRESSION: 1. Stable appearance of trimalleolar ankle fracture. 2. Interval placement of fiberglass splint. Electronically Signed   By: San Morelle M.D.   On: 05/25/2021 12:50   DG Ankle Complete Left  Result Date: 05/25/2021 CLINICAL DATA:  Trauma, pain EXAM: LEFT ANKLE COMPLETE - 3+ VIEW COMPARISON:  None. FINDINGS: There is recent oblique fracture in the distal metaphysis of fibula.  There is posterior and lateral displacement of distal major fracture fragment. There is comminuted fracture in the base of medial malleolus extending to the distal metaphyseal region of tibia. Fracture line involves articular surface. There is widening of joint space in the anterior and medial aspect of the ankle mortise. IMPRESSION: Slightly displaced fractures are seen in the distal left tibia and fibula. There is subluxation in the ankle joint. Electronically Signed   By: Elmer Picker M.D.   On: 05/25/2021 11:01   DG Knee Complete 4 Views Right  Result Date: 05/25/2021 CLINICAL DATA:  Trauma, fall EXAM: RIGHT KNEE - COMPLETE 4+ VIEW COMPARISON:  None. FINDINGS: No recent fracture or dislocation is seen. There is no significant effusion in the suprapatellar bursa. Bony spurs seen in patella. There is  minimal undulation in the articular surface of patella without definite break in the cortical margins. This may be normal variation or residual change from previous trauma. There is joint space narrowing in the medial compartment. Arterial calcifications are seen in the soft tissues. IMPRESSION: No recent displaced fracture or dislocation is seen in the right knee. There is no significant effusion. Degenerative changes are noted with bony spurs in the the patellofemoral compartment. Electronically Signed   By: Elmer Picker M.D.   On: 05/25/2021 11:04    EKG: None obtained in ED  Assessment/Plan Left trimalleolar ankle fracture     - admit to inpt, med-surg     - XR as above     - leg in splint     - ortho to take to OR tomorrow     - can have DM diet tonight, he's NPOpMN     - PT/OT after procedure  DM2 DM neuropathy     - A1c, SSI, glucose checks, DM diet; NPOpMN  HTN     - resume home regimen  HLD     - resume home regimen  Chronic thrombocytopenia Normocytic anemia     - no evidence of bleed, follow  DVT prophylaxis: lovenox  Code Status: DNR confirmed w/ dtr at bedside  Family Communication: w/ dtr at bedside  Consults called: EDP spoke with Orthopedics (Dr. Doran Durand)   Status is: Inpatient  Remains inpatient appropriate because: need of surgical repair of ankle  Linden Tagliaferro A Miquan Tandon DO [facility]  If 7PM-7AM, please contact night-coverage [email or web address]  05/25/2021, 1:15 PM

## 2021-05-25 NOTE — ED Triage Notes (Signed)
EMS reports from from home, witnessed slip and fall on sidewalk, c/o left ankle pain. Noted edema on arrival.  BP 132 HR 100 RR 18 Sp02 98 RA CBG 188

## 2021-05-25 NOTE — ED Notes (Signed)
ED TO INPATIENT HANDOFF REPORT  Name/Age/Gender Albert Rogers 88 y.o. male  Code Status Code Status History     Date Active Date Inactive Code Status Order ID Comments User Context   07/16/2020 1020 07/19/2020 1756 DNR [id number]  Karmen Bongo, MD ED    Questions for Most Recent Historical Code Status (Order [id number])     Question Answer   In the event of cardiac or respiratory ARREST Do not call a code blue   In the event of cardiac or respiratory ARREST Do not perform Intubation, CPR, defibrillation or ACLS   In the event of cardiac or respiratory ARREST Use medication by any route, position, wound care, and other measures to relive pain and suffering. May use oxygen, suction and manual treatment of airway obstruction as needed for comfort.            Home/SNF/Other   Chief Complaint Ankle fracture [S82.899A]  Level of Care/Admitting Diagnosis ED Disposition     ED Disposition  Admit   Condition  --   Comment  Hospital Area: [facility] [[id number]]  Level of Care: Med-Surg [16]  May admit patient to [facility] or [facility] if equivalent level of care is available:: No  Covid Evaluation: Confirmed COVID Negative  Diagnosis: Ankle fracture [834196]  Admitting Physician: Jonnie Finner [[id number]]  Attending Physician: Jonnie Finner [[id number]]  Estimated length of stay: past midnight tomorrow  Certification:: I certify this patient will need inpatient services for at least 2 midnights          Medical History Past Medical History:  Diagnosis Date   Diabetes ([place])    Macular degeneration    Prostate cancer ([place])     Allergies Allergies  Allergen Reactions   Codeine Other (See Comments)    Wife states pt hallucinates with codeine Other reaction(s): Hallucinations Other reaction(s): Hallucinations    IV Location/Drains/Wounds Patient Lines/Drains/Airways Status     Active Line/Drains/Airways     Name Placement date Placement  time Site Days   Peripheral IV 05/25/21 20 G Left Antecubital 05/25/21  1111  Antecubital  less than 1            Labs/Imaging Results for orders placed or performed during the hospital encounter of 05/25/21 (from the past 48 hour(s))  Type and screen [place]     Status: None   Collection Time: 05/25/21 12:18 PM  Result Value Ref Range   ABO/RH(D) O POS    Antibody Screen NEG    Sample Expiration      05/28/2021,2359 Performed at [facility], [place] [address], [place], [place] [address]   Basic metabolic panel     Status: Abnormal   Collection Time: 05/25/21 12:22 PM  Result Value Ref Range   Sodium 136 135 - 145 mmol/L   Potassium 3.5 3.5 - 5.1 mmol/L   Chloride 102 98 - 111 mmol/L   CO2 27 22 - 32 mmol/L   Glucose, Bld 143 (H) 70 - 99 mg/dL    Comment: Glucose reference range applies only to samples taken after fasting for at least 8 hours.   BUN 15 8 - 23 mg/dL   Creatinine, Ser 0.67 0.61 - 1.24 mg/dL   Calcium 8.6 (L) 8.9 - 10.3 mg/dL   GFR, Estimated >60 >60 mL/min    Comment: (NOTE) Calculated using the CKD-EPI Creatinine Equation (2021)    Anion gap 7 5 - 15    Comment: Performed  at [facility], [place] [address], [place], [place] [address]  CBC WITH DIFFERENTIAL     Status: Abnormal   Collection Time: 05/25/21 12:22 PM  Result Value Ref Range   WBC 6.9 4.0 - 10.5 K/uL   RBC 4.23 4.22 - 5.81 MIL/uL   Hemoglobin 12.2 (L) 13.0 - 17.0 g/dL   HCT 38.0 (L) 39.0 - 52.0 %   MCV 89.8 80.0 - 100.0 fL   MCH 28.8 26.0 - 34.0 pg   MCHC 32.1 30.0 - 36.0 g/dL   RDW 14.9 11.5 - 15.5 %   Platelets 131 (L) 150 - 400 K/uL    Comment: Immature Platelet Fraction may be clinically indicated, consider ordering this additional test [id number]    nRBC 0.0 0.0 - 0.2 %   Neutrophils Relative % 82 %   Neutro Abs 5.5 1.7 - 7.7 K/uL   Lymphocytes Relative 11 %   Lymphs Abs 0.7 0.7 - 4.0 K/uL   Monocytes Relative 7 %    Monocytes Absolute 0.5 0.1 - 1.0 K/uL   Eosinophils Relative 0 %   Eosinophils Absolute 0.0 0.0 - 0.5 K/uL   Basophils Relative 0 %   Basophils Absolute 0.0 0.0 - 0.1 K/uL   Immature Granulocytes 0 %   Abs Immature Granulocytes 0.03 0.00 - 0.07 K/uL    Comment: Performed at [facility], [place] [address], [place], [place] [address]  Urinalysis, Routine w reflex microscopic Urine, Clean Catch     Status: Abnormal   Collection Time: 05/25/21 12:22 PM  Result Value Ref Range   Color, Urine YELLOW YELLOW   APPearance CLEAR CLEAR   Specific Gravity, Urine 1.010 1.005 - 1.030   pH 5.0 5.0 - 8.0   Glucose, UA NEGATIVE NEGATIVE mg/dL   Hgb urine dipstick NEGATIVE NEGATIVE   Bilirubin Urine NEGATIVE NEGATIVE   Ketones, ur NEGATIVE NEGATIVE mg/dL   Protein, ur 100 (A) NEGATIVE mg/dL   Nitrite NEGATIVE NEGATIVE   Leukocytes,Ua NEGATIVE NEGATIVE   RBC / HPF 0-5 0 - 5 RBC/hpf   WBC, UA 0-5 0 - 5 WBC/hpf   Bacteria, UA NONE SEEN NONE SEEN    Comment: Performed at [facility], [place] [address] [place], [place] [address]  Resp Panel by RT-PCR (Flu A&B, Covid) Nasopharyngeal Swab     Status: None   Collection Time: 05/25/21 12:22 PM   Specimen: Nasopharyngeal Swab; Nasopharyngeal(NP) swabs in vial transport medium  Result Value Ref Range   SARS Coronavirus 2 by RT PCR NEGATIVE NEGATIVE    Comment: (NOTE) SARS-CoV-2 target nucleic acids are NOT DETECTED.  The SARS-CoV-2 RNA is generally detectable in upper respiratory specimens during the acute phase of infection. The lowest concentration of SARS-CoV-2 viral copies this assay can detect is 138 copies/mL. A negative result does not preclude SARS-Cov-2 infection and should not be used as the sole basis for treatment or other patient management decisions. A negative result may occur with  improper specimen collection/handling, submission of specimen other than nasopharyngeal swab, presence of viral mutation(s)  within the areas targeted by this assay, and inadequate number of viral copies(<138 copies/mL). A negative result must be combined with clinical observations, patient history, and epidemiological information. The expected result is Negative.  Fact Sheet for Patients:  EntrepreneurPulse.com.au  Fact Sheet for Healthcare Providers:  IncredibleEmployment.be  This test is no t yet approved or cleared by the [place] FDA and  has been authorized for detection and/or diagnosis of SARS-CoV-2  by FDA under an Emergency Use Authorization (EUA). This EUA will remain  in effect (meaning this test can be used) for the duration of the COVID-19 declaration under Section 564(b)(1) of the Act, 21 U.S.C.section 360bbb-3(b)(1), unless the authorization is terminated  or revoked sooner.       Influenza A by PCR NEGATIVE NEGATIVE   Influenza B by PCR NEGATIVE NEGATIVE    Comment: (NOTE) The Xpert Xpress SARS-CoV-2/FLU/RSV plus assay is intended as an aid in the diagnosis of influenza from Nasopharyngeal swab specimens and should not be used as a sole basis for treatment. Nasal washings and aspirates are unacceptable for Xpert Xpress SARS-CoV-2/FLU/RSV testing.  Fact Sheet for Patients: EntrepreneurPulse.com.au  Fact Sheet for Healthcare Providers: IncredibleEmployment.be  This test is not yet approved or cleared by the [place] FDA and has been authorized for detection and/or diagnosis of SARS-CoV-2 by FDA under an Emergency Use Authorization (EUA). This EUA will remain in effect (meaning this test can be used) for the duration of the COVID-19 declaration under Section 564(b)(1) of the Act, 21 U.S.C. section 360bbb-3(b)(1), unless the authorization is terminated or revoked.  Performed at [facility], [place] [address], [place], [place] [address]    DG Tibia/Fibula Left  Result Date:  05/25/2021 CLINICAL DATA:  Trauma, fall EXAM: LEFT TIBIA AND FIBULA - 2 VIEW COMPARISON:  None. FINDINGS: There is fracture in the base of medial malleolus extending to the posterior aspect of distal tibia. There is slightly displaced fracture in the distal metaphyseal region of fibula. There is widening of joint space in the medial and anterior aspects of the ankle suggesting subluxation. Scattered arterial calcifications are seen in the soft tissues. There is soft tissue swelling around the ankle. IMPRESSION: Slightly displaced fractures are seen in the distal left tibia and fibula. Subluxation is noted in the left ankle joint. Electronically Signed   By: Elmer Picker M.D.   On: 05/25/2021 11:02   DG Ankle 2 Views Left  Result Date: 05/25/2021 CLINICAL DATA:  Ankle fracture.  Status post reduction. EXAM: LEFT ANKLE - 2 VIEW COMPARISON:  Two-view tibia and fibular radiographs 05/25/2021. CT of the ankle 05/25/2021. FINDINGS: The ankle is splinted. Displaced posterior tibial fracture unchanged. Displaced distal fibular fracture is unchanged. Ankle joint remains displaced medially. IMPRESSION: 1. Stable appearance of trimalleolar ankle fracture. 2. Interval placement of fiberglass splint. Electronically Signed   By: San Morelle M.D.   On: 05/25/2021 12:50   DG Ankle Complete Left  Result Date: 05/25/2021 CLINICAL DATA:  Trauma, pain EXAM: LEFT ANKLE COMPLETE - 3+ VIEW COMPARISON:  None. FINDINGS: There is recent oblique fracture in the distal metaphysis of fibula. There is posterior and lateral displacement of distal major fracture fragment. There is comminuted fracture in the base of medial malleolus extending to the distal metaphyseal region of tibia. Fracture line involves articular surface. There is widening of joint space in the anterior and medial aspect of the ankle mortise. IMPRESSION: Slightly displaced fractures are seen in the distal left tibia and fibula. There is subluxation in  the ankle joint. Electronically Signed   By: Elmer Picker M.D.   On: 05/25/2021 11:01   CT Ankle Left Wo Contrast  Result Date: 05/25/2021 CLINICAL DATA:  Ankle trauma, fracture. EXAM: CT OF THE LEFT ANKLE WITHOUT CONTRAST TECHNIQUE: Multidetector CT imaging of the left ankle was performed according to the standard protocol. Multiplanar CT image reconstructions were also generated. COMPARISON:  Radiographs dated May 25, 2021 FINDINGS: Bones/Joint/Cartilage  Posteriorly displaced fracture of the distal fibula with approximately 6 mm posterior displacement of the distal fracture fragment. Mildly displaced fracture of the posterior malleolus with approximately 2 mm posterior displacement. There is mildly displaced fracture of the medial malleolus. There is lateral displacement at the tibiotalar joint. Subtalar joint is intact. The talonavicular joint is maintained. Ligaments Suboptimally assessed by CT. Muscles and Tendons Soft tissue swelling about the ankle. No evidence of tendon entrapment. Soft tissues Prominent subcutaneous soft tissue edema about the ankle. IMPRESSION: Trimalleolar fracture with dislocation at the tibiotalar joint. No evidence of tendon entrapment or intra-articular osseous fragment. Electronically Signed   By: Keane Police D.O.   On: 05/25/2021 13:42   DG Knee Complete 4 Views Right  Result Date: 05/25/2021 CLINICAL DATA:  Trauma, fall EXAM: RIGHT KNEE - COMPLETE 4+ VIEW COMPARISON:  None. FINDINGS: No recent fracture or dislocation is seen. There is no significant effusion in the suprapatellar bursa. Bony spurs seen in patella. There is minimal undulation in the articular surface of patella without definite break in the cortical margins. This may be normal variation or residual change from previous trauma. There is joint space narrowing in the medial compartment. Arterial calcifications are seen in the soft tissues. IMPRESSION: No recent displaced fracture or dislocation is  seen in the right knee. There is no significant effusion. Degenerative changes are noted with bony spurs in the the patellofemoral compartment. Electronically Signed   By: Elmer Picker M.D.   On: 05/25/2021 11:04    Pending Labs Unresulted Labs (From admission, onward)     Start     Ordered   05/25/21 1330  ABO/Rh  Once,   STAT        05/25/21 1330   05/25/21 1246  Protime-INR  Once,   R        05/25/21 1246   Signed and Held  CBC  (enoxaparin (LOVENOX)    CrCl >/= 30 ml/min)  Once,   R       Comments: Baseline for enoxaparin therapy IF NOT ALREADY DRAWN.  Notify MD if PLT < 100 K.    Signed and Held   Signed and Held  Creatinine, serum  (enoxaparin (LOVENOX)    CrCl >/= 30 ml/min)  Weekly,   R     Comments: while on enoxaparin therapy    Signed and Held   Signed and Held  Comprehensive metabolic panel  Tomorrow morning,   R        Signed and Held   Signed and Held  CBC  Tomorrow morning,   R        Signed and Held   Signed and Held  Hemoglobin A1c  Once,   R       Comments: To assess prior glycemic control    Signed and Held            Vitals/Pain Today's Vitals   05/25/21 1223 05/25/21 1230 05/25/21 1300 05/25/21 1315  BP: (!) 126/101 139/90 138/90   Pulse: 91 (!) 111 (!) 107 (!) 120  Resp: 16  16   Temp:      TempSrc:      SpO2: 98% 96% 97% 95%  Weight:      Height:      PainSc:        Isolation Precautions No active isolations  Medications Medications  sodium chloride 0.9 % bolus 1,000 mL (1,000 mLs Intravenous New Bag/Given 05/25/21 1222)    And  0.9 %  sodium chloride infusion (has no administration in time range)  morphine 4 MG/ML injection 4 mg (has no administration in time range)  morphine 4 MG/ML injection 4 mg (4 mg Intravenous Given 05/25/21 1112)  ondansetron (ZOFRAN) injection 4 mg (4 mg Intravenous Given 05/25/21 1112)    Mobility walks with person assist

## 2021-05-25 NOTE — ED Provider Notes (Signed)
[facility] DEPT Provider Note   CSN: [id number] Arrival date & time: 05/25/21  1014     History  Chief Complaint  Patient presents with   Fall   Ankle Pain    Albert Rogers is a 88 y.o. male.  Pt is a 88 yo wm with a hx of dm, htn, and high cholesterol.  Pt presents to the ED today with left ankle pain from a fall.  Pt said there was a slick spot outside his house.  He slipped and fell.  He has neuropathy in both legs and is not sure exactly what caused him to fall.  Pt said he was unable to put any weight on his left ankle.  He also hurt his right knee, but is able to move that ok.  Pt denies hitting his head.  He is not on blood thinners.  Pt is very HOH.  Right ear hears better.      Home Medications Prior to Admission medications   Medication Sig Start Date End Date Taking? Authorizing Provider  lisinopril (ZESTRIL) 10 MG tablet Take 10 mg by mouth daily. 11/06/20  Yes [provider]  metFORMIN (GLUCOPHAGE) 500 MG tablet Take 500 mg by mouth 2 (two) times daily. 07/01/20  Yes [provider]  Multiple Vitamins-Minerals (OCUVITE ADULT 50+) CAPS Take 1 capsule by mouth daily.   Yes [provider]  rosuvastatin (CRESTOR) 10 MG tablet Take 10 mg by mouth 3 (three) times a week. Monday Wednesday and Friday 07/03/20  Yes [provider]  VITAMIN D PO Take 1 capsule by mouth daily.   Yes [provider]  Accu-Chek Softclix Lancets lancets Use 1 lancet as directed 05/25/17   [provider]  amLODipine (NORVASC) 5 MG tablet Take 1 tablet (5 mg total) by mouth daily. Patient not taking: Reported on 05/25/2021 07/20/20   Caren Griffins, MD  glucose blood test strip Use to check blood sugars 05/05/17   [provider]  glucose blood test strip Use 1 test strip to check blood sugars 02/12/18   [provider]      Allergies    Codeine    Review of Systems   Review of Systems   Musculoskeletal:        Left ankle pain, right knee  All other systems reviewed and are negative.  Physical Exam Updated Vital Signs BP (!) 126/101    Pulse 91    Temp (!) 97.5 F (36.4 C) (Oral)    Resp 16    Ht 5\' 10"  (1.778 m)    Wt 87.5 kg    SpO2 98%    BMI 27.69 kg/m  Physical Exam Vitals and nursing note reviewed.  Constitutional:      Appearance: Normal appearance.  HENT:     Head: Normocephalic and atraumatic.     Right Ear: External ear normal.     Left Ear: External ear normal.     Nose: Nose normal.     Mouth/Throat:     Mouth: Mucous membranes are moist.     Pharynx: Oropharynx is clear.  Eyes:     Extraocular Movements: Extraocular movements intact.     Conjunctiva/sclera: Conjunctivae normal.     Pupils: Pupils are equal, round, and reactive to light.  Cardiovascular:     Rate and Rhythm: Normal rate and regular rhythm.     Pulses: Normal pulses.     Heart sounds: Normal heart sounds.  Pulmonary:  Effort: Pulmonary effort is normal.     Breath sounds: Normal breath sounds.  Abdominal:     General: Abdomen is flat. Bowel sounds are normal.     Palpations: Abdomen is soft.  Musculoskeletal:     Cervical back: Normal range of motion and neck supple.       Legs:     Comments: Left ankle swollen and tender.  Right knee with some abrasions, but good rom.  Skin:    General: Skin is warm.     Capillary Refill: Capillary refill takes less than 2 seconds.  Neurological:     General: No focal deficit present.     Mental Status: He is alert and oriented to person, place, and time.  Psychiatric:        Mood and Affect: Mood normal.        Behavior: Behavior normal.    ED Results / Procedures / Treatments   Labs (all labs ordered are listed, but only abnormal results are displayed) Labs Reviewed  BASIC METABOLIC PANEL - Abnormal; Notable for the following components:      Result Value   Glucose, Bld 143 (*)    Calcium 8.6 (*)    All other components  within normal limits  CBC WITH DIFFERENTIAL/PLATELET - Abnormal; Notable for the following components:   Hemoglobin 12.2 (*)    HCT 38.0 (*)    Platelets 131 (*)    All other components within normal limits  URINALYSIS, ROUTINE W REFLEX MICROSCOPIC - Abnormal; Notable for the following components:   Protein, ur 100 (*)    All other components within normal limits  RESP PANEL BY RT-PCR (FLU A&B, COVID) ARPGX2  PROTIME-INR  TYPE AND SCREEN    EKG None  Radiology DG Tibia/Fibula Left  Result Date: 05/25/2021 CLINICAL DATA:  Trauma, fall EXAM: LEFT TIBIA AND FIBULA - 2 VIEW COMPARISON:  None. FINDINGS: There is fracture in the base of medial malleolus extending to the posterior aspect of distal tibia. There is slightly displaced fracture in the distal metaphyseal region of fibula. There is widening of joint space in the medial and anterior aspects of the ankle suggesting subluxation. Scattered arterial calcifications are seen in the soft tissues. There is soft tissue swelling around the ankle. IMPRESSION: Slightly displaced fractures are seen in the distal left tibia and fibula. Subluxation is noted in the left ankle joint. Electronically Signed   By: Elmer Picker M.D.   On: 05/25/2021 11:02   DG Ankle 2 Views Left  Result Date: 05/25/2021 CLINICAL DATA:  Ankle fracture.  Status post reduction. EXAM: LEFT ANKLE - 2 VIEW COMPARISON:  Two-view tibia and fibular radiographs 05/25/2021. CT of the ankle 05/25/2021. FINDINGS: The ankle is splinted. Displaced posterior tibial fracture unchanged. Displaced distal fibular fracture is unchanged. Ankle joint remains displaced medially. IMPRESSION: 1. Stable appearance of trimalleolar ankle fracture. 2. Interval placement of fiberglass splint. Electronically Signed   By: San Morelle M.D.   On: 05/25/2021 12:50   DG Ankle Complete Left  Result Date: 05/25/2021 CLINICAL DATA:  Trauma, pain EXAM: LEFT ANKLE COMPLETE - 3+ VIEW COMPARISON:   None. FINDINGS: There is recent oblique fracture in the distal metaphysis of fibula. There is posterior and lateral displacement of distal major fracture fragment. There is comminuted fracture in the base of medial malleolus extending to the distal metaphyseal region of tibia. Fracture line involves articular surface. There is widening of joint space in the anterior and medial aspect of the ankle mortise.  IMPRESSION: Slightly displaced fractures are seen in the distal left tibia and fibula. There is subluxation in the ankle joint. Electronically Signed   By: Elmer Picker M.D.   On: 05/25/2021 11:01   DG Knee Complete 4 Views Right  Result Date: 05/25/2021 CLINICAL DATA:  Trauma, fall EXAM: RIGHT KNEE - COMPLETE 4+ VIEW COMPARISON:  None. FINDINGS: No recent fracture or dislocation is seen. There is no significant effusion in the suprapatellar bursa. Bony spurs seen in patella. There is minimal undulation in the articular surface of patella without definite break in the cortical margins. This may be normal variation or residual change from previous trauma. There is joint space narrowing in the medial compartment. Arterial calcifications are seen in the soft tissues. IMPRESSION: No recent displaced fracture or dislocation is seen in the right knee. There is no significant effusion. Degenerative changes are noted with bony spurs in the the patellofemoral compartment. Electronically Signed   By: Elmer Picker M.D.   On: 05/25/2021 11:04    Procedures Reduction of fracture  Date/Time: 05/25/2021 1:14 PM Performed by: Isla Pence, MD Authorized by: Isla Pence, MD  Consent: Verbal consent obtained. Consent given by: patient Patient identity confirmed: verbally with patient Local anesthesia used: no  Anesthesia: Local anesthesia used: no  Sedation: Patient sedated: no  Patient tolerance: patient tolerated the procedure well with no immediate complications   .Ortho Injury  Treatment  Date/Time: 05/25/2021 1:14 PM Performed by: Isla Pence, MD Authorized by: Isla Pence, MD   Consent:    Consent obtained:  Verbal   Consent given by:  PatientInjury location: ankle Location details: left ankle Injury type: fracture-dislocation Fracture type: trimalleolar Pre-procedure neurovascular assessment: neurovascularly intact Pre-procedure distal perfusion: normal Pre-procedure neurological function: normal Pre-procedure range of motion: normal  Anesthesia: Local anesthesia used: no  Patient sedated: NoManipulation performed: yes Skeletal traction used: yes Reduction successful: no Immobilization: splint Splint Applied by: ED Provider and Ortho Tech Supplies used: aluminum splint, cotton padding and Ortho-Glass Post-procedure neurovascular assessment: post-procedure neurovascularly intact Post-procedure distal perfusion: normal Post-procedure neurological function: normal Post-procedure range of motion: normal Comments: Left ankle was swollen.  His ankle would move, but would fall back out out of placement when I let go.  Pt placed in a splint.      Medications Ordered in ED Medications  sodium chloride 0.9 % bolus 1,000 mL (1,000 mLs Intravenous New Bag/Given 05/25/21 1222)    And  0.9 %  sodium chloride infusion (has no administration in time range)  morphine 4 MG/ML injection 4 mg (has no administration in time range)  morphine 4 MG/ML injection 4 mg (4 mg Intravenous Given 05/25/21 1112)  ondansetron (ZOFRAN) injection 4 mg (4 mg Intravenous Given 05/25/21 1112)    ED Course/ Medical Decision Making/ A&P                           Medical Decision Making  Pt does have a distal tib/fib fracture on xray.  He is given morphine for pain and is feeling better.  Pt d/w Dr. Doran Durand.  He is able to get pt on the OR schedule for tomorrow.  He recommends npo after midnight.  No blood thinners.  I tried to call pt's wife, but she could not hear me.   I called his daughter and told her what the plan was going to be.  She will contact her mother.  Pt d/w Dr. Marylyn Ishihara ([facility]) for admission.  Final Clinical Impression(s) / ED Diagnoses Final diagnoses:  Closed trimalleolar fracture of left ankle, initial encounter  Fall, initial encounter  Contusion of right knee, initial encounter    Rx / DC Orders ED Discharge Orders     None         Isla Pence, MD 05/25/21 1319

## 2021-05-26 ENCOUNTER — Inpatient Hospital Stay (HOSPITAL_COMMUNITY): Payer: Medicare Other | Admitting: Anesthesiology

## 2021-05-26 ENCOUNTER — Inpatient Hospital Stay (HOSPITAL_COMMUNITY): Payer: Medicare Other

## 2021-05-26 ENCOUNTER — Encounter (HOSPITAL_COMMUNITY): Payer: Self-pay | Admitting: Internal Medicine

## 2021-05-26 ENCOUNTER — Encounter (HOSPITAL_COMMUNITY)
Admission: EM | Disposition: A | Discharge status: Skilled Nursing Facility | Payer: Self-pay | Source: Home / Self Care | Attending: Family Medicine

## 2021-05-26 DIAGNOSIS — H9190 Unspecified hearing loss, unspecified ear: Secondary | ICD-10-CM

## 2021-05-26 DIAGNOSIS — S82852A Displaced trimalleolar fracture of left lower leg, initial encounter for closed fracture: Secondary | ICD-10-CM | POA: Diagnosis not present

## 2021-05-26 DIAGNOSIS — H3530 Unspecified macular degeneration: Secondary | ICD-10-CM

## 2021-05-26 DIAGNOSIS — S82892A Other fracture of left lower leg, initial encounter for closed fracture: Secondary | ICD-10-CM | POA: Diagnosis not present

## 2021-05-26 DIAGNOSIS — S93432A Sprain of tibiofibular ligament of left ankle, initial encounter: Secondary | ICD-10-CM | POA: Diagnosis not present

## 2021-05-26 DIAGNOSIS — D696 Thrombocytopenia, unspecified: Secondary | ICD-10-CM

## 2021-05-26 DIAGNOSIS — J690 Pneumonitis due to inhalation of food and vomit: Secondary | ICD-10-CM | POA: Insufficient documentation

## 2021-05-26 DIAGNOSIS — R6510 Systemic inflammatory response syndrome (SIRS) of non-infectious origin without acute organ dysfunction: Secondary | ICD-10-CM

## 2021-05-26 HISTORY — PX: ORIF ANKLE FRACTURE: SHX5408

## 2021-05-26 LAB — COMPREHENSIVE METABOLIC PANEL
ALT: 18 U/L (ref 0–44)
AST: 25 U/L (ref 15–41)
Albumin: 3.4 g/dL — ABNORMAL LOW (ref 3.5–5.0)
Alkaline Phosphatase: 62 U/L (ref 38–126)
Anion gap: 10 (ref 5–15)
BUN: 15 mg/dL (ref 8–23)
CO2: 23 mmol/L (ref 22–32)
Calcium: 8.5 mg/dL — ABNORMAL LOW (ref 8.9–10.3)
Chloride: 100 mmol/L (ref 98–111)
Creatinine, Ser: 0.75 mg/dL (ref 0.61–1.24)
GFR, Estimated: >60 mL/min (ref >60)
Glucose, Bld: 163 mg/dL — ABNORMAL HIGH (ref 70–99)
Potassium: 4.1 mmol/L (ref 3.5–5.1)
Sodium: 133 mmol/L — ABNORMAL LOW (ref 135–145)
Total Bilirubin: 1.1 mg/dL (ref 0.3–1.2)
Total Protein: 6.3 g/dL — ABNORMAL LOW (ref 6.5–8.1)

## 2021-05-26 LAB — CBC
HCT: 34.7 % — ABNORMAL LOW (ref 39.0–52.0)
Hemoglobin: 11.2 g/dL — ABNORMAL LOW (ref 13.0–17.0)
MCH: 28.6 pg (ref 26.0–34.0)
MCHC: 32.3 g/dL (ref 30.0–36.0)
MCV: 88.7 fL (ref 80.0–100.0)
Platelets: 105 10*3/uL — ABNORMAL LOW (ref 150–400)
RBC: 3.91 MIL/uL — ABNORMAL LOW (ref 4.22–5.81)
RDW: 14.8 % (ref 11.5–15.5)
WBC: 5 10*3/uL (ref 4.0–10.5)
nRBC: 0 % (ref 0.0–0.2)

## 2021-05-26 LAB — CBC WITH DIFFERENTIAL/PLATELET
Abs Immature Granulocytes: 0.06 10*3/uL (ref 0.00–0.07)
Basophils Absolute: 0 10*3/uL (ref 0.0–0.1)
Basophils Relative: 0 %
Eosinophils Absolute: 0 10*3/uL (ref 0.0–0.5)
Eosinophils Relative: 0 %
HCT: 32.1 % — ABNORMAL LOW (ref 39.0–52.0)
Hemoglobin: 10.4 g/dL — ABNORMAL LOW (ref 13.0–17.0)
Immature Granulocytes: 1 %
Lymphocytes Relative: 5 %
Lymphs Abs: 0.4 10*3/uL — ABNORMAL LOW (ref 0.7–4.0)
MCH: 28.8 pg (ref 26.0–34.0)
MCHC: 32.4 g/dL (ref 30.0–36.0)
MCV: 88.9 fL (ref 80.0–100.0)
Monocytes Absolute: 0.6 10*3/uL (ref 0.1–1.0)
Monocytes Relative: 8 %
Neutro Abs: 6.6 10*3/uL (ref 1.7–7.7)
Neutrophils Relative %: 86 %
Platelets: 109 10*3/uL — ABNORMAL LOW (ref 150–400)
RBC: 3.61 MIL/uL — ABNORMAL LOW (ref 4.22–5.81)
RDW: 14.8 % (ref 11.5–15.5)
WBC: 7.7 10*3/uL (ref 4.0–10.5)
nRBC: 0 % (ref 0.0–0.2)

## 2021-05-26 LAB — BASIC METABOLIC PANEL
Anion gap: 9 (ref 5–15)
BUN: 15 mg/dL (ref 8–23)
CO2: 22 mmol/L (ref 22–32)
Calcium: 8 mg/dL — ABNORMAL LOW (ref 8.9–10.3)
Chloride: 100 mmol/L (ref 98–111)
Creatinine, Ser: 0.88 mg/dL (ref 0.61–1.24)
GFR, Estimated: >60 mL/min (ref >60)
Glucose, Bld: 181 mg/dL — ABNORMAL HIGH (ref 70–99)
Potassium: 4.2 mmol/L (ref 3.5–5.1)
Sodium: 131 mmol/L — ABNORMAL LOW (ref 135–145)

## 2021-05-26 LAB — GLUCOSE, CAPILLARY
Glucose-Capillary: 138 mg/dL — ABNORMAL HIGH (ref 70–99)
Glucose-Capillary: 119 mg/dL — ABNORMAL HIGH (ref 70–99)
Glucose-Capillary: 172 mg/dL — ABNORMAL HIGH (ref 70–99)
Glucose-Capillary: 133 mg/dL — ABNORMAL HIGH (ref 70–99)

## 2021-05-26 LAB — SURGICAL PCR SCREEN
MRSA, PCR: NEGATIVE
Staphylococcus aureus: NEGATIVE

## 2021-05-26 SURGERY — OPEN REDUCTION INTERNAL FIXATION (ORIF) ANKLE FRACTURE
Anesthesia: General | Site: Ankle | Laterality: Left

## 2021-05-26 MED ORDER — FENTANYL CITRATE PF 50 MCG/ML IJ SOSY
PREFILLED_SYRINGE | INTRAMUSCULAR | Status: AC
  Administered 2021-05-26: 50 ug via INTRAVENOUS
  Filled 2021-05-26: qty 1

## 2021-05-26 MED ORDER — LACTATED RINGERS IV BOLUS
1000.0000 mL | Freq: Once | INTRAVENOUS | Status: DC
Start: 2021-05-26 — End: 2021-05-28

## 2021-05-26 MED ORDER — VANCOMYCIN HCL 1000 MG IV SOLR
INTRAVENOUS | Status: AC
  Filled 2021-05-26: qty 20

## 2021-05-26 MED ORDER — FENTANYL CITRATE (PF) 100 MCG/2ML IJ SOLN
INTRAMUSCULAR | Status: DC | PRN
  Administered 2021-05-26 (×8): 25 ug via INTRAVENOUS

## 2021-05-26 MED ORDER — FENTANYL CITRATE (PF) 100 MCG/2ML IJ SOLN
INTRAMUSCULAR | Status: AC
  Filled 2021-05-26: qty 2

## 2021-05-26 MED ORDER — FENTANYL CITRATE PF 50 MCG/ML IJ SOSY
50.0000 ug | PREFILLED_SYRINGE | INTRAMUSCULAR | Status: DC
  Administered 2021-05-26: 25 ug via INTRAVENOUS

## 2021-05-26 MED ORDER — SODIUM CHLORIDE 0.9 % IR SOLN
Status: DC | PRN
  Administered 2021-05-26: 1000 mL

## 2021-05-26 MED ORDER — LIDOCAINE 2% (20 MG/ML) 5 ML SYRINGE
INTRAMUSCULAR | Status: DC | PRN
  Administered 2021-05-26: 80 mg via INTRAVENOUS

## 2021-05-26 MED ORDER — ONDANSETRON HCL 4 MG/2ML IJ SOLN
INTRAMUSCULAR | Status: DC | PRN
  Administered 2021-05-26: 4 mg via INTRAVENOUS

## 2021-05-26 MED ORDER — FENTANYL CITRATE PF 50 MCG/ML IJ SOSY
PREFILLED_SYRINGE | INTRAMUSCULAR | Status: AC
  Filled 2021-05-26: qty 1

## 2021-05-26 MED ORDER — METRONIDAZOLE 500 MG/100ML IV SOLN
500.0000 mg | Freq: Two times a day (BID) | INTRAVENOUS | Status: DC
  Administered 2021-05-26 – 2021-05-27 (×2): 500 mg via INTRAVENOUS
  Filled 2021-05-26 (×3): qty 100

## 2021-05-26 MED ORDER — SODIUM CHLORIDE 0.9 % IV SOLN
1.0000 g | INTRAVENOUS | Status: DC
  Administered 2021-05-26: 1 g via INTRAVENOUS
  Filled 2021-05-26 (×2): qty 10

## 2021-05-26 MED ORDER — PROPOFOL 10 MG/ML IV BOLUS
INTRAVENOUS | Status: DC | PRN
  Administered 2021-05-26: 100 mg via INTRAVENOUS

## 2021-05-26 MED ORDER — ISOPROPYL ALCOHOL 70 % SOLN
Status: AC
  Filled 2021-05-26: qty 480

## 2021-05-26 MED ORDER — CEFAZOLIN SODIUM-DEXTROSE 1-4 GM/50ML-% IV SOLN
1.0000 g | Freq: Three times a day (TID) | INTRAVENOUS | Status: AC
  Administered 2021-05-27 (×2): 1 g via INTRAVENOUS
  Filled 2021-05-26 (×2): qty 50

## 2021-05-26 MED ORDER — LIP MEDEX EX OINT
TOPICAL_OINTMENT | CUTANEOUS | Status: AC
  Filled 2021-05-26: qty 7

## 2021-05-26 MED ORDER — FENTANYL CITRATE PF 50 MCG/ML IJ SOSY
25.0000 ug | PREFILLED_SYRINGE | INTRAMUSCULAR | Status: DC | PRN
  Administered 2021-05-26 (×2): 50 ug via INTRAVENOUS

## 2021-05-26 MED ORDER — VANCOMYCIN HCL 1000 MG IV SOLR
INTRAVENOUS | Status: DC | PRN
  Administered 2021-05-26: 1000 mg via TOPICAL

## 2021-05-26 MED ORDER — LACTATED RINGERS IV SOLN: INTRAVENOUS | Status: DC

## 2021-05-26 MED ORDER — ROPIVACAINE HCL 5 MG/ML IJ SOLN
INTRAMUSCULAR | Status: DC | PRN
  Administered 2021-05-26: 15 mL via PERINEURAL

## 2021-05-26 MED ORDER — FENTANYL CITRATE PF 50 MCG/ML IJ SOSY
PREFILLED_SYRINGE | INTRAMUSCULAR | Status: AC
  Filled 2021-05-26: qty 2

## 2021-05-26 MED ORDER — PROPOFOL 10 MG/ML IV BOLUS
INTRAVENOUS | Status: AC
  Filled 2021-05-26: qty 20

## 2021-05-26 MED ORDER — ONDANSETRON HCL 4 MG/2ML IJ SOLN
INTRAMUSCULAR | Status: AC
  Filled 2021-05-26: qty 2

## 2021-05-26 MED ORDER — BUPIVACAINE-EPINEPHRINE (PF) 0.5% -1:200000 IJ SOLN
INTRAMUSCULAR | Status: DC | PRN
  Administered 2021-05-26: 20 mL via PERINEURAL

## 2021-05-26 MED ORDER — LIDOCAINE HCL (PF) 2 % IJ SOLN
INTRAMUSCULAR | Status: AC
  Filled 2021-05-26: qty 5

## 2021-05-26 SURGICAL SUPPLY — 64 items
BAG COUNTER SPONGE SURGICOUNT (BAG) IMPLANT
BAG ZIPLOCK 12X15 (MISCELLANEOUS) ×2 IMPLANT
BIT DRILL 2.4X140 LONG SOLID (BIT) ×1 IMPLANT
BIT DRILL 2.5X2.75 QC CALB (BIT) ×1 IMPLANT
BLADE HEX COATED 2.75 (ELECTRODE) ×1 IMPLANT
BNDG ELASTIC 4X5.8 VLCR STR LF (GAUZE/BANDAGES/DRESSINGS) ×2 IMPLANT
BNDG ELASTIC 6X15 VLCR STRL LF (GAUZE/BANDAGES/DRESSINGS) ×1 IMPLANT
BNDG ELASTIC 6X5.8 VLCR STR LF (GAUZE/BANDAGES/DRESSINGS) ×2 IMPLANT
BNDG GAUZE ELAST 4 BULKY (GAUZE/BANDAGES/DRESSINGS) ×2 IMPLANT
CHLORAPREP W/TINT 26 (MISCELLANEOUS) ×2 IMPLANT
COVER SURGICAL LIGHT HANDLE (MISCELLANEOUS) ×2 IMPLANT
CUFF TOURN SGL QUICK 34 (TOURNIQUET CUFF) ×2
CUFF TRNQT CYL 34X4.125X (TOURNIQUET CUFF) ×1 IMPLANT
DRAPE C-ARM 42X120 X-RAY (DRAPES) IMPLANT
DRAPE EXTREMITY T 121X128X90 (DISPOSABLE) ×2 IMPLANT
DRAPE OEC MINIVIEW 54X84 (DRAPES) ×2 IMPLANT
DRAPE U-SHAPE 47X51 STRL (DRAPES) ×2 IMPLANT
DRSG PAD ABDOMINAL 8X10 ST (GAUZE/BANDAGES/DRESSINGS) ×6 IMPLANT
ELECT REM PT RETURN 15FT ADLT (MISCELLANEOUS) ×2 IMPLANT
GAUZE PACKING 1 X5 YD ST (GAUZE/BANDAGES/DRESSINGS) ×1 IMPLANT
GAUZE SPONGE 4X4 12PLY STRL (GAUZE/BANDAGES/DRESSINGS) ×3 IMPLANT
GAUZE XEROFORM 5X9 LF (GAUZE/BANDAGES/DRESSINGS) ×2 IMPLANT
GLOVE SURG MICRO LTX SZ7.5 (GLOVE) ×2 IMPLANT
GLOVE SURG UNDER LTX SZ7.5 (GLOVE) ×2 IMPLANT
GOWN STRL REUS W/TWL LRG LVL3 (GOWN DISPOSABLE) ×2 IMPLANT
K-WIRE ACE 1.6X6 (WIRE) ×8 IMPLANT
KIT BASIN OR (CUSTOM PROCEDURE TRAY) ×2 IMPLANT
KIT TURNOVER KIT A (KITS) IMPLANT
KWIRE ACE 1.6X6 (WIRE) IMPLANT
NS IRRIG 1000ML POUR BTL (IV SOLUTION) ×2 IMPLANT
PACK TOTAL JOINT (CUSTOM PROCEDURE TRAY) ×2 IMPLANT
PAD CAST 4YDX4 CTTN HI CHSV (CAST SUPPLIES) ×4 IMPLANT
PADDING CAST COTTON 4X4 STRL (CAST SUPPLIES) ×8
PADDING CAST COTTON 6X4 STRL (CAST SUPPLIES) ×2 IMPLANT
PLATE LOCK 4H 109 LT DIST FIB (Plate) ×1 IMPLANT
PLATE MEDIAL MALLEOLUS 4H HOOK (Plate) ×1 IMPLANT
PROTECTOR NERVE ULNAR (MISCELLANEOUS) ×2 IMPLANT
SCREW 3.5X22 (Screw) ×1 IMPLANT
SCREW CORT T15 TPR 55X3.5XST (Screw) IMPLANT
SCREW CORTICAL 3.5X55MM (Screw) ×2 IMPLANT
SCREW LOCK CORT STAR 3.5X14 (Screw) ×2 IMPLANT
SCREW LOCK CORT STAR 3.5X16 (Screw) ×5 IMPLANT
SCREW LOCK PLATE R3 3.5X10 (Screw) ×1 IMPLANT
SCREW LOCK PLATE R3 3.5X22 (Screw) ×1 IMPLANT
SCREW LOCK PLATE R3 3.5X24 (Screw) ×1 IMPLANT
SCREW LOW PROFILE 18MMX3.5MM (Screw) ×1 IMPLANT
SCREW LOW PROFILE 3.5X48MM (Screw) ×1 IMPLANT
SCREW LP 3.5 (Screw) ×1 IMPLANT
SCREW NLCK 28X3.5XNS R3CON NON (Screw) IMPLANT
SCREW NON LOCKING 3.5X20 (Screw) ×1 IMPLANT
SCREW NON LOCKING LP 3.5 16MM (Screw) ×1 IMPLANT
SCREW NONLOCK 3.5X28 (Screw) ×2 IMPLANT
SPLINT FIBERGLASS 4X30 (CAST SUPPLIES) ×1 IMPLANT
SPLINT FIBERGLASS 5X30 (CAST SUPPLIES) ×1 IMPLANT
SPONGE T-LAP 18X18 ~~LOC~~+RFID (SPONGE) ×2 IMPLANT
STOCKINETTE 4X48 STRL (DRAPES) ×2 IMPLANT
SUCTION FRAZIER HANDLE 10FR (MISCELLANEOUS) ×2
SUCTION TUBE FRAZIER 10FR DISP (MISCELLANEOUS) ×1 IMPLANT
SUT ETHILON 3 0 PS 1 (SUTURE) ×6 IMPLANT
SUT VIC AB 2-0 SH 27 (SUTURE) ×2
SUT VIC AB 2-0 SH 27XBRD (SUTURE) ×1 IMPLANT
SUT VIC AB 3-0 SH 27 (SUTURE) ×2
SUT VIC AB 3-0 SH 27X BRD (SUTURE) ×2 IMPLANT
WATER STERILE IRR 1000ML POUR (IV SOLUTION) ×2 IMPLANT

## 2021-05-26 NOTE — Progress Notes (Signed)
PROGRESS NOTE    Treston Coker  GUR:[id number] DOB: 08-Jan-1933 DOA: 05/25/2021 PCP: Alroy Dust, L.Marlou Sa, MD  Chief Complaint  Patient presents with   Fall   Ankle Pain    Brief Narrative:  Townsend Cudworth is Tyjanae Bartek 88 y.o. male with medical history significant of DM2, HLD, HTN. Presenting with left ankle pain. He report that he was walking out from his house down [address] [place] ramp. It was slick and so he slipped. He felt to his left and rolled his ankle. He did not hit his head. He remembers the entire fall. There was no LOC. He tried to get up, but he couldn't put weight on his left foot. He crawled to the base of the ramp and called for EMS. He denies any other aggravating or alleviating factors.    ED Course: He was noted to have Brodan Grewell left trimalleolar ankle fracture. He was placed in Billie Trager splint. Orthopedics was consulted. [facility] was called for admission.     Assessment & Plan:   Principal Problem:   Ankle fracture Active Problems:   SIRS (systemic inflammatory response syndrome) (HCC)   Type 2 diabetes mellitus without complication (HCC)   Dyslipidemia   Benign essential hypertension   Thrombocytopenia (HCC)   Hard of hearing   Macular degeneration   * Ankle fracture- (present on admission) Trimalleolar fx with dislocation at tibiotalar joint No s/o L distal fibula open reduction internal fixation, left medial malleolus open reduction internal fixation, left open syndesmosis reduction and internal fixation on 1/11 with orthopedic Strict NWB operative extremity, max elevation - NWB 2-3 months, maintain short leg splint DVT ppx on POD1 PT/OT 1 week f/u for wound check Sutures outin 3 weeks with exchange of short leg splint to short leg cast  SIRS (systemic inflammatory response syndrome) ([place]) Post op fever, tachycardia On 2 L [place] Blood cx, CXR, urinalysis and urine cx Will start with presumptive treatment for possible pneumonia and w/u additionally as needed  Type 2 diabetes mellitus without  complication ([place]) SSI On metformin at home  Dyslipidemia- (present on admission) crestor  Thrombocytopenia ([place]) trend  Benign essential hypertension- (present on admission) lisinopril  Macular degeneration At risk for delirium  Hard of hearing At risk for delirium   DVT prophylaxis: lovenox Code Status: DNR Family Communication: daughter Disposition:   Status is: Inpatient  Remains inpatient appropriate because: therapy, fever       Consultants:  orthopedics  Procedures:  PROCEDURE: Left distal fibula open reduction internal fixation Left medial malleolus open reduction internal fixation Left open syndesmosis reduction and internal fixation  Antimicrobials:  Anti-infectives (From admission, onward)    Start     Dose/Rate Route Frequency Ordered Stop   05/26/21 2115  cefTRIAXone (ROCEPHIN) 1 g in sodium chloride 0.9 % 100 mL IVPB        1 g 200 mL/hr over 30 Minutes Intravenous Every 24 hours 05/26/21 2020     05/26/21 2115  metroNIDAZOLE (FLAGYL) IVPB 500 mg        500 mg 100 mL/hr over 60 Minutes Intravenous Every 12 hours 05/26/21 2020     05/26/21 1458  vancomycin (VANCOCIN) powder  Status:  Discontinued          As needed 05/26/21 1458 05/26/21 1917   05/26/21 0600  ceFAZolin (ANCEF) IVPB 2g/100 mL premix        2 g 200 mL/hr over 30 Minutes Intravenous On call to O.R. 05/25/21 1601 05/26/21 1412  Subjective: No c/o pain, comfortable  Objective: Vitals:   05/26/21 1842 05/26/21 1845 05/26/21 1900 05/26/21 1923  BP: (!) 159/92 (!) 153/99 (!) 152/95 120/72  Pulse: (!) 117 (!) 118 (!) 114 (!) 115  Resp: (!) 23 (!) 23 19 20   Temp:   99.7 F (37.6 C) (!) 100.7 F (38.2 C)  TempSrc:    Oral  SpO2: 99% 99% 100% 98%  Weight:      Height:        Intake/Output Summary (Last 24 hours) at 05/26/2021 2024 Last data filed at 05/26/2021 1722 Gross per 24 hour  Intake 2343.62 ml  Output 775 ml  Net 1568.62 ml   Filed Weights   05/25/21  1026  Weight: 87.5 kg    Examination:  General exam: Appears calm and comfortable  Respiratory system: Clear to auscultation. Respiratory effort normal. Cardiovascular system: regular rate, tachycardic Gastrointestinal system: Abdomen is nondistended, soft and nontender. Central nervous system: hard of hearing, responds appropriately to questions Extremities: LLE in splint, no significant swelling of RLE, no TTP     Data Reviewed: I have personally reviewed following labs and imaging studies  CBC: Recent Labs  Lab 05/25/21 1222 05/26/21 0251 05/26/21 1958  WBC 6.9 5.0 7.7  NEUTROABS 5.5  --  6.6  HGB 12.2* 11.2* 10.4*  HCT 38.0* 34.7* 32.1*  MCV 89.8 88.7 88.9  PLT 131* 105* 109*    Basic Metabolic Panel: Recent Labs  Lab 05/25/21 1222 05/26/21 0251 05/26/21 1958  NA 136 133* 131*  K 3.5 4.1 4.2  CL 102 100 100  CO2 27 23 22   GLUCOSE 143* 163* 181*  BUN 15 15 15   CREATININE 0.67 0.75 0.88  CALCIUM 8.6* 8.5* 8.0*    GFR: Estimated Creatinine Clearance: 62.2 mL/min (by C-G formula based on SCr of 0.88 mg/dL).  Liver Function Tests: Recent Labs  Lab 05/26/21 0251  AST 25  ALT 18  ALKPHOS 62  BILITOT 1.1  PROT 6.3*  ALBUMIN 3.4*    CBG: Recent Labs  Lab 05/25/21 1722 05/25/21 2028 05/26/21 0733 05/26/21 1203 05/26/21 1727  GLUCAP 128* 110* 138* 119* 172*     Recent Results (from the past 240 hour(s))  Resp Panel by RT-PCR (Flu Darielys Giglia&B, Covid) Nasopharyngeal Swab     Status: None   Collection Time: 05/25/21 12:22 PM   Specimen: Nasopharyngeal Swab; Nasopharyngeal(NP) swabs in vial transport medium  Result Value Ref Range Status   SARS Coronavirus 2 by RT PCR NEGATIVE NEGATIVE Final    Comment: (NOTE) SARS-CoV-2 target nucleic acids are NOT DETECTED.  The SARS-CoV-2 RNA is generally detectable in upper respiratory specimens during the acute phase of infection. The lowest concentration of SARS-CoV-2 viral copies this assay can detect is 138  copies/mL. Hisashi Amadon negative result does not preclude SARS-Cov-2 infection and should not be used as the sole basis for treatment or other patient management decisions. Avid Guillette negative result may occur with  improper specimen collection/handling, submission of specimen other than nasopharyngeal swab, presence of viral mutation(s) within the areas targeted by this assay, and inadequate number of viral copies(<138 copies/mL). Delmas Faucett negative result must be combined with clinical observations, patient history, and epidemiological information. The expected result is Negative.  Fact Sheet for Patients:  EntrepreneurPulse.com.au  Fact Sheet for Healthcare Providers:  IncredibleEmployment.be  This test is no t yet approved or cleared by the [place] FDA and  has been authorized for detection and/or diagnosis of SARS-CoV-2 by FDA under an Emergency Use Authorization (  EUA). This EUA will remain  in effect (meaning this test can be used) for the duration of the COVID-19 declaration under Section 564(b)(1) of the Act, 21 U.S.C.section 360bbb-3(b)(1), unless the authorization is terminated  or revoked sooner.       Influenza Marcey Persad by PCR NEGATIVE NEGATIVE Final   Influenza B by PCR NEGATIVE NEGATIVE Final    Comment: (NOTE) The Xpert Xpress SARS-CoV-2/FLU/RSV plus assay is intended as an aid in the diagnosis of influenza from Nasopharyngeal swab specimens and should not be used as Adelis Docter sole basis for treatment. Nasal washings and aspirates are unacceptable for Xpert Xpress SARS-CoV-2/FLU/RSV testing.  Fact Sheet for Patients: EntrepreneurPulse.com.au  Fact Sheet for Healthcare Providers: IncredibleEmployment.be  This test is not yet approved or cleared by the [place] FDA and has been authorized for detection and/or diagnosis of SARS-CoV-2 by FDA under an Emergency Use Authorization (EUA). This EUA will remain in effect (meaning  this test can be used) for the duration of the COVID-19 declaration under Section 564(b)(1) of the Act, 21 U.S.C. section 360bbb-3(b)(1), unless the authorization is terminated or revoked.  Performed at [facility], [place] [address], [place], [place] [address]   Surgical PCR screen     Status: None   Collection Time: 05/26/21  6:25 AM   Specimen: Nasal Mucosa; Nasal Swab  Result Value Ref Range Status   MRSA, PCR NEGATIVE NEGATIVE Final   Staphylococcus aureus NEGATIVE NEGATIVE Final    Comment: (NOTE) The Xpert SA Assay (FDA approved for NASAL specimens in patients 58 years of age and older), is one component of Kela Baccari comprehensive surveillance program. It is not intended to diagnose infection nor to guide or monitor treatment. Performed at [facility], [place] [address] [place],  [address]          Radiology Studies: DG Tibia/Fibula Left  Result Date: 05/25/2021 CLINICAL DATA:  Trauma, fall EXAM: LEFT TIBIA AND FIBULA - 2 VIEW COMPARISON:  None. FINDINGS: There is fracture in the base of medial malleolus extending to the posterior aspect of distal tibia. There is slightly displaced fracture in the distal metaphyseal region of fibula. There is widening of joint space in the medial and anterior aspects of the ankle suggesting subluxation. Scattered arterial calcifications are seen in the soft tissues. There is soft tissue swelling around the ankle. IMPRESSION: Slightly displaced fractures are seen in the distal left tibia and fibula. Subluxation is noted in the left ankle joint. Electronically Signed   By: Elmer Picker M.D.   On: 05/25/2021 11:02   DG Ankle 2 Views Left  Result Date: 05/26/2021 CLINICAL DATA:  Operative fixation of Jemuel Laursen left ankle trimalleolar fracture. EXAM: LEFT ANKLE - 2 VIEW COMPARISON:  05/25/2021 FINDINGS: Six C-arm views of the left ankle demonstrate screw and plate fixation of the previously demonstrated medial and  lateral malleolus fractures. Improved position and alignment of the posterior malleolus fracture on these views. IMPRESSION: Hardware fixation of the previously demonstrated medial and lateral malleolus fractures with improved position and alignment of the previously demonstrated posterior malleolus fracture. Electronically Signed   By: Claudie Revering M.D.   On: 05/26/2021 17:02   DG Ankle 2 Views Left  Result Date: 05/25/2021 CLINICAL DATA:  Ankle fracture.  Status post reduction. EXAM: LEFT ANKLE - 2 VIEW COMPARISON:  Two-view tibia and fibular radiographs 05/25/2021. CT of the ankle 05/25/2021. FINDINGS: The ankle is splinted. Displaced posterior tibial fracture unchanged. Displaced distal fibular fracture is unchanged. Ankle joint  remains displaced medially. IMPRESSION: 1. Stable appearance of trimalleolar ankle fracture. 2. Interval placement of fiberglass splint. Electronically Signed   By: San Morelle M.D.   On: 05/25/2021 12:50   DG Ankle Complete Left  Result Date: 05/25/2021 CLINICAL DATA:  Trauma, pain EXAM: LEFT ANKLE COMPLETE - 3+ VIEW COMPARISON:  None. FINDINGS: There is recent oblique fracture in the distal metaphysis of fibula. There is posterior and lateral displacement of distal major fracture fragment. There is comminuted fracture in the base of medial malleolus extending to the distal metaphyseal region of tibia. Fracture line involves articular surface. There is widening of joint space in the anterior and medial aspect of the ankle mortise. IMPRESSION: Slightly displaced fractures are seen in the distal left tibia and fibula. There is subluxation in the ankle joint. Electronically Signed   By: Elmer Picker M.D.   On: 05/25/2021 11:01   CT Ankle Left Wo Contrast  Result Date: 05/25/2021 CLINICAL DATA:  Ankle trauma, fracture. EXAM: CT OF THE LEFT ANKLE WITHOUT CONTRAST TECHNIQUE: Multidetector CT imaging of the left ankle was performed according to the standard  protocol. Multiplanar CT image reconstructions were also generated. COMPARISON:  Radiographs dated May 25, 2021 FINDINGS: Bones/Joint/Cartilage Posteriorly displaced fracture of the distal fibula with approximately 6 mm posterior displacement of the distal fracture fragment. Mildly displaced fracture of the posterior malleolus with approximately 2 mm posterior displacement. There is mildly displaced fracture of the medial malleolus. There is lateral displacement at the tibiotalar joint. Subtalar joint is intact. The talonavicular joint is maintained. Ligaments Suboptimally assessed by CT. Muscles and Tendons Soft tissue swelling about the ankle. No evidence of tendon entrapment. Soft tissues Prominent subcutaneous soft tissue edema about the ankle. IMPRESSION: Trimalleolar fracture with dislocation at the tibiotalar joint. No evidence of tendon entrapment or intra-articular osseous fragment. Electronically Signed   By: Keane Police D.O.   On: 05/25/2021 13:42   DG Knee Complete 4 Views Right  Result Date: 05/25/2021 CLINICAL DATA:  Trauma, fall EXAM: RIGHT KNEE - COMPLETE 4+ VIEW COMPARISON:  None. FINDINGS: No recent fracture or dislocation is seen. There is no significant effusion in the suprapatellar bursa. Bony spurs seen in patella. There is minimal undulation in the articular surface of patella without definite break in the cortical margins. This may be normal variation or residual change from previous trauma. There is joint space narrowing in the medial compartment. Arterial calcifications are seen in the soft tissues. IMPRESSION: No recent displaced fracture or dislocation is seen in the right knee. There is no significant effusion. Degenerative changes are noted with bony spurs in the the patellofemoral compartment. Electronically Signed   By: Elmer Picker M.D.   On: 05/25/2021 11:04   DG C-Arm 1-60 Min-No Report  Result Date: 05/26/2021 Fluoroscopy was utilized by the requesting  physician.  No radiographic interpretation.   DG C-Arm 1-60 Min-No Report  Result Date: 05/26/2021 Fluoroscopy was utilized by the requesting physician.  No radiographic interpretation.   DG C-Arm 1-60 Min-No Report  Result Date: 05/26/2021 Fluoroscopy was utilized by the requesting physician.  No radiographic interpretation.        Scheduled Meds:  enoxaparin (LOVENOX) injection  40 mg Subcutaneous Q24H   fentaNYL       insulin aspart  0-15 Units Subcutaneous TID WC   insulin aspart  0-5 Units Subcutaneous QHS   lip balm       lisinopril  10 mg Oral Daily   multivitamin  1 tablet Oral Daily  rosuvastatin  10 mg Oral Once per day on Mon Wed Fri   Continuous Infusions:  cefTRIAXone (ROCEPHIN)  IV     lactated ringers     metronidazole       LOS: 1 day    Time spent: over 30 min    Fayrene Helper, MD [facility]   To contact the attending provider between 7A-7P or the covering provider during after hours 7P-7A, please log into the web site [email or web address] and access using universal Central password for that web site. If you do not have the password, please call the hospital operator.  05/26/2021, 8:24 PM

## 2021-05-26 NOTE — Assessment & Plan Note (Signed)
At risk for delirium

## 2021-05-26 NOTE — Assessment & Plan Note (Signed)
crestor

## 2021-05-26 NOTE — Assessment & Plan Note (Addendum)
Post op fever, tachycardia CXR with developing infiltrate R base, suspect aspiration pneumonia He's improving, will transition to oral abx Blood cx pending UA 1/10 not c/w UTI Follow outpatient

## 2021-05-26 NOTE — Progress Notes (Signed)
°   05/26/21 1923  Assess: MEWS Score  Temp (!) 100.7 F (38.2 C)  BP 120/72  Pulse Rate (!) 115  Resp 20  Level of Consciousness Alert  SpO2 98 %  O2 Device Nasal Cannula  O2 Flow Rate (L/min) 2 L/min  Assess: MEWS Score  MEWS Temp 1  MEWS Systolic 0  MEWS Pulse 2  MEWS RR 0  MEWS LOC 0  MEWS Score 3  MEWS Score Color Yellow  Assess: if the MEWS score is Yellow or Red  Were vital signs taken at a resting state? Yes  Focused Assessment Change from prior assessment (see assessment flowsheet)  Does the patient meet 2 or more of the SIRS criteria? No  MEWS guidelines implemented *See Row Information* Yes  Take Vital Signs  Increase Vital Sign Frequency  Yellow: Q 2hr X 2 then Q 4hr X 2, if remains yellow, continue Q 4hrs  Escalate  MEWS: Escalate Yellow: discuss with charge nurse/RN and consider discussing with provider and RRT  Notify: Charge Nurse/RN  Name of Charge Nurse/RN Notified Margretta Sidle, RN  Date Charge Nurse/RN Notified 05/26/21  Time Charge Nurse/RN Notified 1923  Notify: Provider  Provider Name/Title Elodia Florence., MD  Date Provider Notified 05/26/21  Time Provider Notified 1925  Notification Type  (secure chat)  Notification Reason Change in status  Provider response En route (coming to see the patient)  Date of Provider Response 05/26/21  Time of Provider Response 1925  Assess: SIRS CRITERIA  SIRS Temperature  0  SIRS Pulse 1  SIRS Respirations  0  SIRS WBC 0  SIRS Score Sum  1

## 2021-05-26 NOTE — Op Note (Addendum)
05/26/2021  5:40 PM   PATIENT: Albert Rogers  88 y.o. male  MRN: [id number]   PRE-OPERATIVE DIAGNOSIS:   Left trimalleolar ankle fracture dislocation   POST-OPERATIVE DIAGNOSIS:   Left trimalleolar ankle fracture dislocation   PROCEDURE: Left distal fibula open reduction internal fixation Left medial malleolus open reduction internal fixation Left open syndesmosis reduction and internal fixation   SURGEON:  Armond Hang, MD   ASSISTANT: None   ANESTHESIA: General, regional   EBL: Minimal   TOURNIQUET:    Total Tourniquet Time Documented: Thigh (Left) - 118 minutes Total: Thigh (Left) - 287 minutes    COMPLICATIONS: None apparent   DISPOSITION: Extubated, awake and stable to recovery.   INDICATION FOR PROCEDURE: Ortho was consulted for this 88 year old male patient's left ankle fracture. He is currently admitted to [facility] under Medicine service. He has been NPO since midnight in anticipation of surgery today. He is accompanied by his daughter who helps with communication as he is severely hard of hearing. He fell at home on 05/25/21 sustaining left trimalleolar ankle fracture dislocation. He was reduced in ED and splinted. Patient reports no pain unless he moves his left ankle in splint.   Of note, the patient is a diabetic. He is not on any blood thinners at baseline. He is not a smoker.  We discussed the diagnosis, alternative treatment options, risks and benefits of the above surgical intervention, as well as alternative non-operative treatments. All questions/concerns were addressed and the patient/family demonstrated appropriate understanding of the diagnosis, the procedure, the postoperative course, and overall prognosis. The patient wished to proceed with surgical intervention and signed an informed surgical consent as such, in each others presence prior to surgery.   PROCEDURE IN DETAIL: After preoperative consent was obtained and the  correct operative site was identified, the patient was brought to the operating room supine on stretcher and transferred onto operating table. General anesthesia was induced. Preoperative antibiotics were administered. Surgical timeout was taken. The patient was then positioned supine with an ipsilateral hip bump. The operative lower extremity was prepped and draped in standard sterile fashion with a tourniquet around the thigh. The extremity was exsanguinated and the tourniquet was inflated to 275 mmHg.  A standard lateral incision was made over the distal fibula. Dissection was carried down to the level of the fibula and the fracture site identified. The superficial peroneal nerve was identified and protected throughout the procedure. The fibula was noted to be shortened with interposed periosteum. The fracture was debrided and the edges defined to achieve cortical read. Reduction maneuver was performed using pointed reduction forceps and lobster forceps. In this manner, the fibula length was restored and fracture reduced. Due to poor bone quality and extensive comminution at the fracture site, it was decided to use an anatomic locking distal fibula plate. We then selected a Zimmer anatomic locking plate to match the anatomy of the distal fibula and placed it laterally. This was implanted under intraoperative fluoroscopy with a combination of distal locking screws and proximal cortical & locking screws.  We then made a direct medial ankle approach and extended this proximally in anticipation of implanting a hook plate. Dissection was carried down to the level of the medial malleolar fragment. A dental pick and freer elevator were used to reduce the medial malleolar fragment. Of note, there was extensive comminution of this fragment into multiple segments, all of which had very poor bone quality. A Paragon28 medial distal tibia hook plate was utilized  to fix the reduced medial malleolar fragment and the tines  were carefully inserted into the distal tip of the malleolus. The plate was applied posterosuperior to anterioinferior to best capture the major fragments of the medial comminution. We then placed a non-locking screw in the hook plate in compression mode to further obtain compression across the fracture. We implanted locking screws proximally to further fixate the plate.  Due to his diabetes, we then drilled for and implanted three sequential syndesmotic 3.5 mm screws through the previously implanted lateral fibula plate. We verified appropriate position of these screws in all views under fluoroscopy.   Intraoperative stress fluoroscopy showed stable alignment of the ankle mortise to testing upon fixation.  The surgical sites were thoroughly irrigated. The tourniquet was deflated and hemostasis achieved. Vancomycin powder was applied to the surgical site. The deep layers were closed using 2-0 vicryl and the subcutaneous tissues closed using 3-0 vicryl. The skin was closed without tension using 3-0 nylon suture.    The leg was cleaned with saline and sterile xeroform dressings with gauze were applied. A well padded bulky short leg splint was applied. The patient was awakened from anesthesia and transported to the recovery room in stable condition.    FOLLOW UP PLAN: -transfer to PACU, then return to [facility] under hospitalist team -IV Ancef x 24 hrs -strict NWB operative extremity, maximum elevation. Plan is to maintain NWB for 2-3 months given his diabetes and severe injury pattern with fracture dislocation -maintain short leg splint until follow up -pain meds per primary team -DVT ppx per primary team preference to begin POD1 -physical therapy to assist with NWB restrictions training -anticipate discharge to SNF/inpatient rehab -plan for follow up in 1 week as outpatient for wound check -sutures out in 3 weeks with exchange of short leg splint to short leg cast in outpatient  office   RADIOGRAPHS: AP, lateral, oblique and stress radiographs of the left ankle were obtained intraoperatively. These showed interval reduction and fixation of the fractures. Manual stress radiographs were taken and the ankle mortise and tibiofibular relationship were noted to be stable following fixation. All hardware is appropriately positioned and of the appropriate lengths. No other acute injuries are noted.   Armond Hang Orthopaedic Surgery [facility]

## 2021-05-26 NOTE — Brief Op Note (Signed)
05/26/2021  5:39 PM  PATIENT:  Albert Rogers  88 y.o. male  PRE-OPERATIVE DIAGNOSIS:  Left trimalleolar ankle fracture dislocation  POST-OPERATIVE DIAGNOSIS:  Left trimalleolar ankle fracture dislocation  PROCEDURE:  Procedure(s): OPEN REDUCTION INTERNAL FIXATION (ORIF) ANKLE FRACTURE (Left)  SURGEON:  Surgeon(s) and Role:    * Armond Hang, MD - Primary  PHYSICIAN ASSISTANT: None  ASSISTANTS: none   ANESTHESIA:   regional and general  EBL:  Minimal   BLOOD ADMINISTERED:none  DRAINS: none   LOCAL MEDICATIONS USED:  OTHER Vanc powder  SPECIMEN:  No Specimen  DISPOSITION OF SPECIMEN:  N/A  COUNTS:  YES  TOURNIQUET:   Total Tourniquet Time Documented: Thigh (Left) - 118 minutes Total: Thigh (Left) - 118 minutes   DICTATION: .Note written in EPIC  PLAN OF CARE:  Return to inpatient  PATIENT DISPOSITION:  PACU - hemodynamically stable.   Delay start of Pharmacological VTE agent (>24hrs) due to surgical blood loss or risk of bleeding: yes

## 2021-05-26 NOTE — Transfer of Care (Signed)
Immediate Anesthesia Transfer of Care Note  Patient: Albert Rogers  Procedure(s) Performed: OPEN REDUCTION INTERNAL FIXATION (ORIF) ANKLE FRACTURE (Left: Ankle)  Patient Location: PACU  Anesthesia Type:General  Level of Consciousness: sedated  Airway & Oxygen Therapy: Patient Spontanous Breathing and Patient connected to face mask oxygen  Post-op Assessment: Report given to RN and Post -op Vital signs reviewed and stable  Post vital signs: Reviewed and stable  Last Vitals:  Vitals Value Taken Time  BP 128/77 05/26/21 1720  Temp    Pulse 103 05/26/21 1722  Resp 34 05/26/21 1722  SpO2 95 % 05/26/21 1722  Vitals shown include unvalidated device data.  Last Pain:  Vitals:   05/26/21 1320  TempSrc: Oral  PainSc:       Patients Stated Pain Goal: 2 (26/37/85 8850)  Complications: No notable events documented.

## 2021-05-26 NOTE — Progress Notes (Signed)
Subjective: Day of Surgery Procedure(s) (LRB): OPEN REDUCTION INTERNAL FIXATION (ORIF) ANKLE FRACTURE (Left)  I have been asked to help manage this patient's left ankle fracture. He is currently admitted to [facility] under Medicine service. He has been NPO since midnight in anticipation of surgery today. He is accompanied by his daughter who helps with communication as he is severely hard of hearing. He fell at home on 05/25/21 sustaining left trimalleolar ankle fracture dislocation. He was reduced in ED and splinted. Patient reports no pain unless he moves his left ankle in splint.  Of note, the patient is a diabetic. He is not on any blood thinners at baseline. He is not a smoker.  Objective:   VITALS:  Temp:  [98.2 F (36.8 C)-99.9 F (37.7 C)] 99 F (37.2 C) (01/11 1046) Pulse Rate:  [89-120] 89 (01/11 1046) Resp:  [16-20] 17 (01/11 1046) BP: (118-148)/(70-98) 134/83 (01/11 1046) SpO2:  [91 %-99 %] 95 % (01/11 1046)  Gen: AAOx3, NAD  Left lower extremity: Short leg splint in place Wiggles toes SILT over toes CR<2s    LABS Recent Labs    05/25/21 1222 05/26/21 0251  HGB 12.2* 11.2*  WBC 6.9 5.0  PLT 131* 105*   Recent Labs    05/25/21 1222 05/26/21 0251  NA 136 133*  K 3.5 4.1  CL 102 100  CO2 27 23  BUN 15 15  CREATININE 0.67 0.75  GLUCOSE 143* 163*   Recent Labs    05/25/21 1512  INR 1.0     Assessment/Plan: Day of Surgery Procedure(s) (LRB): OPEN REDUCTION INTERNAL FIXATION (ORIF) ANKLE FRACTURE (Left)  -plan for ORIF of left ankle today without fixation of posterior malleolus  -surgical plan and perioperative care reviewed at length with patient and his daughter at bedside -XR and CT left ankle from yesterday reviewed showing extensive medial malleolus comminution with displacement as well as comminuted distal fibula fracture and minimally displaced posterior malleolus fracture as well -NPO since midnight, no blood thinners today -he will have  to be strictly NWB for 6-12 wk postop especially considering he is a geriatric diabetic. I anticipate he will need inpatient rehab after surgery -PT/OT consult postop -appreciate primary team care  The risks and benefits were presented and reviewed. The risks due to hardware failure/irritation, new/persistent infection, stiffness, nerve/vessel/tendon injury, nonunion/malunion, wound healing issues, development of arthritis, failure of this surgery, possibility of external fixation with delayed definitive surgery, need for further surgery, thromboembolic events, anesthesia/medical complications, amputation, death among others were discussed. The patient acknowledged the explanation and agreed to proceed with the plan.   Armond Hang 05/26/2021, 12:43 PM

## 2021-05-26 NOTE — Anesthesia Procedure Notes (Signed)
Anesthesia Regional Block: Popliteal block   Pre-Anesthetic Checklist: , timeout performed,  Correct Patient, Correct Site, Correct Laterality,  Correct Procedure, Correct Position, site marked,  Risks and benefits discussed,  Surgical consent,  Pre-op evaluation,  At surgeon's request and post-op pain management  Laterality: Left  Prep: chloraprep       Needles:  Injection technique: Single-shot  Needle Type: Echogenic Stimulator Needle     Needle Length: 9cm  Needle Gauge: 21     Additional Needles:   Procedures:,,,, ultrasound used (permanent image in chart),,    Narrative:  Start time: 03/16/2022 1:50 PM End time: 03/16/2022 1:55 PM Injection made incrementally with aspirations every 5 mL.  Performed by: Personally  Anesthesiologist: Coralee Edberg D, MD  Additional Notes: Patient tolerated the procedure well. Local anesthetic introduced in an incremental fashion under minimal resistance after negative aspirations. No paresthesias were elicited. After completion of the procedure, no acute issues were identified and patient continued to be monitored by RN.        

## 2021-05-26 NOTE — Discharge Instructions (Addendum)
Albert Hang, MD [facility]  Please read the following information regarding your care after surgery.  Medications  You only need a prescription for the narcotic pain medicine (ex. oxycodone, Percocet, Norco).  All of the other medicines listed below are available over the counter. ? Aleve 2 pills twice a day for the first 3 days after surgery. ? acetominophen (Tylenol) 650 mg every 4-6 hours as you need for minor to moderate pain ? oxycodone as prescribed for severe pain  Weight Bearing ? Do NOT bear any weight on the operated leg or foot.  Cast / Splint / Dressing ? If you have a splint, do NOT remove this. Keep your splint, cast or dressing clean and dry.  Dont put anything (coat hanger, pencil, etc) down inside of it.  If it gets wet, call the office immediately to schedule an appointment for a cast change.  Swelling IMPORTANT: It is normal for you to have swelling where you had surgery. To reduce swelling and pain, keep at least 3 pillows under your leg so that your toes are above your nose and your heel is above the level of your hip.  It may be necessary to keep your foot or leg elevated for several weeks.  This is critical to helping your incisions heal and your pain to feel better.  Follow Up Call my office at [phone] when you are discharged from the hospital or surgery center to schedule an appointment to be seen 1 week after surgery.  Call my office at [phone] if you develop a fever >101.5 F, nausea, vomiting, bleeding from the surgical site or severe pain.

## 2021-05-26 NOTE — Anesthesia Preprocedure Evaluation (Signed)
Anesthesia Evaluation  Patient identified by MRN, date of birth, ID band Patient awake    Reviewed: Allergy & Precautions, NPO status , Patient's Chart, lab work & pertinent test results  Airway Mallampati: II  TM Distance: >3 FB Neck ROM: Full    Dental  (+) Missing, Dental Advisory Given   Pulmonary former smoker,    breath sounds clear to auscultation       Cardiovascular hypertension, Pt. on medications  Rhythm:Regular Rate:Normal     Neuro/Psych negative neurological ROS  negative psych ROS   GI/Hepatic negative GI ROS, Neg liver ROS,   Endo/Other  diabetes, Type 2, Oral Hypoglycemic Agents  Renal/GU      Musculoskeletal negative musculoskeletal ROS (+)   Abdominal Normal abdominal exam  (+)   Peds  Hematology   Anesthesia Other Findings   Reproductive/Obstetrics                             Anesthesia Physical Anesthesia Plan  ASA: 3  Anesthesia Plan: General   Post-op Pain Management: Regional block   Induction: Intravenous  PONV Risk Score and Plan: 3 and Ondansetron and Treatment may vary due to age or medical condition  Airway Management Planned: LMA  Additional Equipment: None  Intra-op Plan:   Post-operative Plan: Extubation in OR  Informed Consent: I have reviewed the patients History and Physical, chart, labs and discussed the procedure including the risks, benefits and alternatives for the proposed anesthesia with the patient or authorized representative who has indicated his/her understanding and acceptance.   Patient has DNR.  Discussed DNR with patient and Suspend DNR.   Dental advisory given and Consent reviewed with POA  Plan Discussed with: CRNA  Anesthesia Plan Comments:         Anesthesia Quick Evaluation

## 2021-05-26 NOTE — H&P (Signed)
H&P Update: ? ?-History and Physical Reviewed ? ?-Patient has been re-examined ? ?-No change in the plan of care ? ?-The risks and benefits were presented and reviewed. The risks due to hardware failure/irritation, new/persistent infection, stiffness, nerve/vessel/tendon injury, nonunion/malunion, wound healing issues, development of arthritis, failure of this surgery, possibility of external fixation with delayed definitive surgery, need for further surgery, thromboembolic events, anesthesia/medical complications, amputation, death among others were discussed. The patient acknowledged the explanation, agreed to proceed with the plan and a consent was signed. ? ?Albert Rogers ? ?

## 2021-05-26 NOTE — Anesthesia Procedure Notes (Signed)
Anesthesia Regional Block: Adductor canal block   Pre-Anesthetic Checklist: , timeout performed,  Correct Patient, Correct Site, Correct Laterality,  Correct Procedure, Correct Position, site marked,  Risks and benefits discussed,  Surgical consent,  Pre-op evaluation,  At surgeon's request and post-op pain management  Laterality: Left  Prep: chloraprep       Needles:  Injection technique: Single-shot  Needle Type: Echogenic Stimulator Needle     Needle Length: 9cm  Needle Gauge: 21     Additional Needles:   Procedures:,,,, ultrasound used (permanent image in chart),,    Narrative:  Start time: 03/16/2022 1:55 PM End time: 03/16/2022 2:00 PM Injection made incrementally with aspirations every 5 mL.  Performed by: Personally  Anesthesiologist: Syd Manges D, MD  Additional Notes: Patient tolerated the procedure well. Local anesthetic introduced in an incremental fashion under minimal resistance after negative aspirations. No paresthesias were elicited. After completion of the procedure, no acute issues were identified and patient continued to be monitored by RN.        

## 2021-05-26 NOTE — Hospital Course (Addendum)
Albert Rogers is Albert Rogers 88 y.o. male with medical history significant of DM2, HLD, HTN. Who was admitted for [name] trimalleolar fracture with dislocation at tibiotalar joint.  He's now s/p surgery with orthopedics.  Hospitalization c/b fever post op, being treated for aspiration pneumonia.  Plan is for discharge today to SNF.  See below for additional details

## 2021-05-26 NOTE — Assessment & Plan Note (Addendum)
Trimalleolar fx with dislocation at tibiotalar joint No s/o L distal fibula open reduction internal fixation, left medial malleolus open reduction internal fixation, left open syndesmosis reduction and internal fixation on 1/11 with orthopedic Strict NWB operative extremity, max elevation - NWB 2-3 months, maintain short leg splint until follow up Ortho recommending lovenox for DVT ppx PT/OT 1 week f/u for wound check Sutures outin 3 weeks with exchange of short leg splint to short leg cast Per ortho

## 2021-05-26 NOTE — Assessment & Plan Note (Addendum)
Trend, noted, some degree of chronicity Would follow closely with plan to d/c on lovenox

## 2021-05-26 NOTE — Anesthesia Procedure Notes (Signed)
Procedure Name: LMA Insertion Date/Time: 05/26/2021 2:11 PM Performed by: Lind Covert, CRNA Pre-anesthesia Checklist: Patient identified, Emergency Drugs available, Suction available and Patient being monitored Patient Re-evaluated:Patient Re-evaluated prior to induction Oxygen Delivery Method: Circle system utilized Preoxygenation: Pre-oxygenation with 100% oxygen Induction Type: IV induction Ventilation: Mask ventilation without difficulty LMA: LMA inserted LMA Size: 4.0 Number of attempts: 1 Placement Confirmation: positive ETCO2 and breath sounds checked- equal and bilateral Tube secured with: Tape Dental Injury: Teeth and Oropharynx as per pre-operative assessment

## 2021-05-26 NOTE — Assessment & Plan Note (Signed)
lisinopril 

## 2021-05-26 NOTE — Plan of Care (Signed)
°  Problem: Clinical Measurements: Goal: Ability to maintain clinical measurements within normal limits will improve Outcome: Progressing   Problem: Nutrition: Goal: Adequate nutrition will be maintained Outcome: Progressing   Problem: Elimination: Goal: Will not experience complications related to bowel motility Outcome: Progressing   Problem: Pain Managment: Goal: General experience of comfort will improve Outcome: Progressing

## 2021-05-26 NOTE — Progress Notes (Signed)
AssistedDr. Smith Robert with left, ultrasound guided, popliteal/saphenous, adductor canal block. Side rails up, monitors on throughout procedure. See vital signs in flow sheet. Tolerated Procedure well.

## 2021-05-26 NOTE — Assessment & Plan Note (Signed)
SSI On metformin at home

## 2021-05-26 NOTE — Anesthesia Postprocedure Evaluation (Signed)
Anesthesia Post Note  Patient: Albert Rogers  Procedure(s) Performed: OPEN REDUCTION INTERNAL FIXATION (ORIF) ANKLE FRACTURE (Left: Ankle)     Patient location during evaluation: PACU Anesthesia Type: General Level of consciousness: awake and alert Pain management: pain level controlled Vital Signs Assessment: post-procedure vital signs reviewed and stable Respiratory status: spontaneous breathing, nonlabored ventilation, respiratory function stable and patient connected to nasal cannula oxygen Cardiovascular status: blood pressure returned to baseline and stable Postop Assessment: no apparent nausea or vomiting Anesthetic complications: no   No notable events documented.  Last Vitals:  Vitals:   05/26/21 1845 05/26/21 1900  BP: (!) 153/99 (!) 152/95  Pulse: (!) 118   Resp: (!) 23   Temp:    SpO2: 99% 100%    Last Pain:  Vitals:   05/26/21 1745  TempSrc:   PainSc: Asleep                 Sameer Teeple S

## 2021-05-27 ENCOUNTER — Encounter (HOSPITAL_COMMUNITY): Payer: Self-pay | Admitting: Orthopaedic Surgery

## 2021-05-27 DIAGNOSIS — S82892A Other fracture of left lower leg, initial encounter for closed fracture: Secondary | ICD-10-CM | POA: Diagnosis not present

## 2021-05-27 LAB — COMPREHENSIVE METABOLIC PANEL
ALT: 14 U/L (ref 0–44)
AST: 20 U/L (ref 15–41)
Albumin: 3 g/dL — ABNORMAL LOW (ref 3.5–5.0)
Alkaline Phosphatase: 47 U/L (ref 38–126)
Anion gap: 9 (ref 5–15)
BUN: 17 mg/dL (ref 8–23)
CO2: 24 mmol/L (ref 22–32)
Calcium: 7.8 mg/dL — ABNORMAL LOW (ref 8.9–10.3)
Chloride: 101 mmol/L (ref 98–111)
Creatinine, Ser: 0.79 mg/dL (ref 0.61–1.24)
GFR, Estimated: >60 mL/min (ref >60)
Glucose, Bld: 145 mg/dL — ABNORMAL HIGH (ref 70–99)
Potassium: 3.9 mmol/L (ref 3.5–5.1)
Sodium: 134 mmol/L — ABNORMAL LOW (ref 135–145)
Total Bilirubin: 0.5 mg/dL (ref 0.3–1.2)
Total Protein: 5.8 g/dL — ABNORMAL LOW (ref 6.5–8.1)

## 2021-05-27 LAB — CBC WITH DIFFERENTIAL/PLATELET
Abs Immature Granulocytes: 0.03 10*3/uL (ref 0.00–0.07)
Basophils Absolute: 0 10*3/uL (ref 0.0–0.1)
Basophils Relative: 0 %
Eosinophils Absolute: 0 10*3/uL (ref 0.0–0.5)
Eosinophils Relative: 0 %
HCT: 30.9 % — ABNORMAL LOW (ref 39.0–52.0)
Hemoglobin: 9.9 g/dL — ABNORMAL LOW (ref 13.0–17.0)
Immature Granulocytes: 1 %
Lymphocytes Relative: 14 %
Lymphs Abs: 0.7 10*3/uL (ref 0.7–4.0)
MCH: 28.9 pg (ref 26.0–34.0)
MCHC: 32 g/dL (ref 30.0–36.0)
MCV: 90.4 fL (ref 80.0–100.0)
Monocytes Absolute: 0.6 10*3/uL (ref 0.1–1.0)
Monocytes Relative: 12 %
Neutro Abs: 3.6 10*3/uL (ref 1.7–7.7)
Neutrophils Relative %: 73 %
Platelets: 95 10*3/uL — ABNORMAL LOW (ref 150–400)
RBC: 3.42 MIL/uL — ABNORMAL LOW (ref 4.22–5.81)
RDW: 15.2 % (ref 11.5–15.5)
WBC: 4.9 10*3/uL (ref 4.0–10.5)
nRBC: 0 % (ref 0.0–0.2)

## 2021-05-27 LAB — GLUCOSE, CAPILLARY
Glucose-Capillary: 150 mg/dL — ABNORMAL HIGH (ref 70–99)
Glucose-Capillary: 145 mg/dL — ABNORMAL HIGH (ref 70–99)
Glucose-Capillary: 120 mg/dL — ABNORMAL HIGH (ref 70–99)
Glucose-Capillary: 150 mg/dL — ABNORMAL HIGH (ref 70–99)

## 2021-05-27 LAB — PHOSPHORUS: Phosphorus: 4.2 mg/dL (ref 2.5–4.6)

## 2021-05-27 LAB — MAGNESIUM: Magnesium: 2 mg/dL (ref 1.7–2.4)

## 2021-05-27 MED ORDER — AMOXICILLIN-POT CLAVULANATE 875-125 MG PO TABS
1.0000 | ORAL_TABLET | Freq: Two times a day (BID) | ORAL | Status: DC
  Administered 2021-05-27 – 2021-05-28 (×2): 1 via ORAL
  Filled 2021-05-27 (×2): qty 1

## 2021-05-27 NOTE — TOC Initial Note (Signed)
Transition of Care [facility]) - Initial/Assessment Note    Patient Details  Name: Albert Rogers MRN: [id number] Date of Birth: [birth year]  [facility] CM/SW Contact:    Lennart Pall, LCSW Phone Number: 05/27/2021, 1:18 PM  Clinical Narrative:                 Met with pt and daughter today to introduce self/ TOC role with dc planning. Both very pleasant and have already agreed on plan for SNF placement for rehab and are hopeful we can secure a bed at [facility].  Will begin SNF bed search and keep pt/ family/ team updated on progress/ offers.  Expected Discharge Plan: Skilled Nursing Facility Barriers to Discharge: Continued Medical Work up, SNF Pending bed offer   Patient Goals and CMS Choice Patient states their goals for this hospitalization and ongoing recovery are:: to eventually return home following SNF rehab      Expected Discharge Plan and Services Expected Discharge Plan: [place] In-house Referral: Clinical Social Work   Post Acute Care Choice: [place] Living arrangements for the past 2 months: Single Family Home                 DME Arranged: N/A DME Agency: NA                  Prior Living Arrangements/Services Living arrangements for the past 2 months: Single Family Home Lives with:: Spouse Patient language and need for interpreter reviewed:: Yes Do you feel safe going back to the place where you live?: Yes      Need for Family Participation in Patient Care: Yes (Comment) Care giver support system in place?: Yes (comment)   Criminal Activity/Legal Involvement Pertinent to Current Situation/Hospitalization: No - Comment as needed  Activities of Daily Living Home Assistive Devices/Equipment: Eyeglasses, Hearing aid ADL Screening (condition at time of admission) Patient's cognitive ability adequate to safely complete daily activities?: Yes Is the patient deaf or have difficulty hearing?: Yes Does the patient  have difficulty seeing, even when wearing glasses/contacts?: No Does the patient have difficulty concentrating, remembering, or making decisions?: No Patient able to express need for assistance with ADLs?: Yes Does the patient have difficulty dressing or bathing?: No Independently performs ADLs?: Yes (appropriate for developmental age) Does the patient have difficulty walking or climbing stairs?: Yes Weakness of Legs: Left Weakness of Arms/Hands: None  Permission Sought/Granted Permission sought to share information with : Facility Sport and exercise psychologist, Family Supports Permission granted to share information with : Yes, Verbal Permission Granted  Share Information with NAME: Margarito Courser     Permission granted to share info w Relationship: daughter  Permission granted to share info w Contact Information: [phone]  Emotional Assessment Appearance:: Appears stated age Attitude/Demeanor/Rapport: Engaged, Gracious Affect (typically observed): Accepting Orientation: : Oriented to Self, Oriented to Place, Oriented to  Time, Oriented to Situation Alcohol / Substance Use: Not Applicable Psych Involvement: No (comment)  Admission diagnosis:  Ankle fracture [S82.899A] Contusion of right knee, initial encounter [S80.01XA] Closed trimalleolar fracture of left ankle, initial encounter [S82.852A] Fall, initial encounter [W19.XXXA] Patient Active Problem List   Diagnosis Date Noted   SIRS (systemic inflammatory response syndrome) ([place]) 05/26/2021   Hard of hearing 05/26/2021   Macular degeneration 05/26/2021   Thrombocytopenia ([place]) 05/26/2021   Ankle fracture 05/25/2021   Prostate cancer ([place]) 04/26/2021   Benign essential hypertension 01/15/2021   Hypercholesterolemia 01/15/2021   Neuritis 01/15/2021   AMS (altered mental  status) 85/92/9244   Acute metabolic encephalopathy 62/86/3817   Type 2 diabetes mellitus without complication ([place]) 71/16/5790   Dyslipidemia 07/16/2020    Exudative senile macular degeneration of retina ([place]) 07/16/2020   PCP:  Alroy Dust, L.Marlou Sa, MD Pharmacy:   [id number]: [phone]: [phone]    Social Determinants of Health (SDOH) Interventions    Readmission Risk Interventions Readmission Risk Prevention Plan 05/27/2021  Transportation Screening Complete  PCP or Specialist Appt within 5-7 Days Complete  Home Care Screening Complete  Medication Review (RN CM) Complete  Some recent data might be hidden

## 2021-05-27 NOTE — NC FL2 (Signed)
[place] LEVEL OF CARE SCREENING TOOL     IDENTIFICATION  Patient Name: Albert Rogers Birthdate: 12-31-1932 Sex: male Admission Date (Current Location): 05/25/2021  [facility] [place] and [place] Number:  Herbalist and Address:  [facility],  [address] [place], [place]      Provider Number: [id number]  Attending Physician Name and Address:  Elodia Florence., *  Relative Name and Phone Number:  daughter, Margarito Courser @ [phone]    Current Level of Care: Hospital Recommended Level of Care: [place] Prior Approval Number:    Date Approved/Denied:   PASRR Number: [id number]  Discharge Plan: SNF    Current Diagnoses: Patient Active Problem List   Diagnosis Date Noted   SIRS (systemic inflammatory response syndrome) ([place]) 05/26/2021   Hard of hearing 05/26/2021   Macular degeneration 05/26/2021   Thrombocytopenia ([place]) 05/26/2021   Ankle fracture 05/25/2021   Prostate cancer ([place]) 04/26/2021   Benign essential hypertension 01/15/2021   Hypercholesterolemia 01/15/2021   Neuritis 01/15/2021   AMS (altered mental status) 26/83/4196   Acute metabolic encephalopathy 22/29/7989   Type 2 diabetes mellitus without complication ([place]) 21/19/4174   Dyslipidemia 07/16/2020   Exudative senile macular degeneration of retina ([place]) 07/16/2020    Orientation RESPIRATION BLADDER Height & Weight     Self, Time, Situation, Place  Normal Continent Weight: 193 lb (87.5 kg) Height:  5\' 10"  (177.8 cm)  BEHAVIORAL SYMPTOMS/MOOD NEUROLOGICAL BOWEL NUTRITION STATUS      Continent    AMBULATORY STATUS COMMUNICATION OF NEEDS Skin   Extensive Assist Verbally  (surgical incision on ankle only)                       Personal Care Assistance Level of Assistance  Bathing, Dressing Bathing Assistance: Limited assistance   Dressing Assistance: Limited assistance     Functional Limitations Info             [place]  PT (By licensed PT), OT (By licensed OT)     PT Frequency: 5x/wk OT Frequency: 5x/wk            Contractures Contractures Info: Not present    Additional Factors Info  Code Status, Insulin Sliding Scale, Allergies Code Status Info: DNR Allergies Info: Codeine   Insulin Sliding Scale Info: see MAR       Current Medications (05/27/2021):  This is the current hospital active medication list Current Facility-Administered Medications  Medication Dose Route Frequency Provider Last Rate Last Admin   acetaminophen (TYLENOL) tablet 650 mg  650 mg Oral Q6H PRN Armond Hang, MD   650 mg at 05/27/21 0814   Or   acetaminophen (TYLENOL) suppository 650 mg  650 mg Rectal Q6H PRN Armond Hang, MD       cefTRIAXone (ROCEPHIN) 1 g in sodium chloride 0.9 % 100 mL IVPB  1 g Intravenous Q24H Elodia Florence., MD 200 mL/hr at 05/26/21 2238 1 g at 05/26/21 2238   enoxaparin (LOVENOX) injection 40 mg  40 mg Subcutaneous Q24H Armond Hang, MD   40 mg at 05/26/21 2239   fentaNYL (SUBLIMAZE) injection 12.5 mcg  12.5 mcg Intravenous Q4H PRN Armond Hang, MD   12.5 mcg at 05/27/21 1050   insulin aspart (novoLOG) injection 0-15 Units  0-15 Units Subcutaneous TID WC Armond Hang, MD   2 Units at 05/27/21 1229   insulin aspart (novoLOG) injection 0-5 Units  0-5 Units  Subcutaneous QHS Armond Hang, MD       lactated ringers bolus 1,000 mL  1,000 mL Intravenous Once Elodia Florence., MD       lisinopril (ZESTRIL) tablet 10 mg  10 mg Oral Daily Armond Hang, MD   10 mg at 05/27/21 0944   metroNIDAZOLE (FLAGYL) IVPB 500 mg  500 mg Intravenous Q12H Elodia Florence., MD 100 mL/hr at 05/27/21 0947 500 mg at 05/27/21 0947   multivitamin (PROSIGHT) tablet 1 tablet  1 tablet Oral Daily Armond Hang, MD   1 tablet at 05/27/21 0944   ondansetron (ZOFRAN) tablet 4 mg  4 mg Oral Q6H PRN Armond Hang, MD       Or   ondansetron [facility])  injection 4 mg  4 mg Intravenous Q6H PRN Armond Hang, MD       rosuvastatin (CRESTOR) tablet 10 mg  10 mg Oral Once per day on Mon Wed Fri Ramanathan, Deepak, MD         Discharge Medications: Please see discharge summary for a list of discharge medications.  Relevant Imaging Results:  Relevant Lab Results:   Additional Information SS#: [id number]; COVID vaccines x 3  [employer], LCSW

## 2021-05-27 NOTE — Progress Notes (Signed)
Physical Therapy Treatment Patient Details Name: Albert Rogers MRN: [id number] DOB: [birth year] Today's Date: 05/27/2021   History of Present Illness Pt s/p fall with L ankle fx and with hx of DM, prostate CA, macular degeneration and HOH    PT Comments    Pt assisted up from [facility] to standing but unable to step back to bedside and maintain NWB on L.  Bed pulled up behind pt and pt assisted back to supine.   Recommendations for follow up therapy are one component of a multi-disciplinary discharge planning process, led by the attending physician.  Recommendations may be updated based on patient status, additional functional criteria and insurance authorization.  Follow Up Recommendations  Skilled nursing-short term rehab (<3 hours/day)     Assistance Recommended at Discharge Frequent or constant Supervision/Assistance  Patient can return home with the following Two people to help with walking and/or transfers;Two people to help with bathing/dressing/bathroom;A lot of help with bathing/dressing/bathroom;Assistance with cooking/housework;Assist for transportation;Help with stairs or ramp for entrance   Equipment Recommendations  None recommended by PT    Recommendations for Other Services       Precautions / Restrictions Precautions Precautions: Fall Restrictions Weight Bearing Restrictions: Yes LLE Weight Bearing: Non weight bearing     Mobility  Bed Mobility Overal bed mobility: Needs Assistance Bed Mobility: Sit to Supine     Supine to sit: Mod assist;+2 for physical assistance;+2 for safety/equipment Sit to supine: Mod assist;+2 for safety/equipment   General bed mobility comments: assist for trunk control and LE managment    Transfers Overall transfer level: Needs assistance Equipment used: Rolling walker (2 wheels) Transfers: Sit to/from Stand Sit to Stand: Max assist;+2 physical assistance;+2 safety/equipment;From elevated surface Stand pivot transfers: Max  assist;+2 physical assistance;+2 safety/equipment;From elevated surface         General transfer comment: Assist to bring wt up and fwd, to balance in standing,    Ambulation/Gait               General Gait Details: Pt unable to step back to bedside and maintain NWB - bed brought up behind pt and moved to sitting   Stairs             Wheelchair Mobility    Modified Rankin (Stroke Patients Only)       Balance Overall balance assessment: Needs assistance Sitting-balance support: No upper extremity supported;Feet supported Sitting balance-Leahy Scale: Good     Standing balance support: Bilateral upper extremity supported Standing balance-Leahy Scale: Zero Standing balance comment: +2 in standing to steady, 3rd person to hold LLE in NWB position                            Cognition Arousal/Alertness: Awake/alert Behavior During Therapy: WFL for tasks assessed/performed Overall Cognitive Status: Within Functional Limits for tasks assessed                                 General Comments: difficulty dual tasking. physical assist needed to maintain LLE NWB        Exercises Other Exercises Other Exercises: BUE general strengthining (chair pushups, AROM with light weights (ie water bottles) discussed with daugther    General Comments General comments (skin integrity, edema, etc.): Daughter present during transfers, second half of session. Pt on RA throughout session with SpO2 >92.      Pertinent Vitals/Pain Pain Assessment:  Faces Faces Pain Scale: Hurts little more Pain Location: L ankle with dependent position Pain Descriptors / Indicators: Aching;Sore Pain Intervention(s): Limited activity within patient's tolerance;Monitored during session;Premedicated before session    Home Living Family/patient expects to be discharged to:: Skilled nursing facility Living Arrangements: Spouse/significant other                       Prior Function            PT Goals (current goals can now be found in the care plan section) Acute Rehab PT Goals Patient Stated Goal: Regain IND PT Goal Formulation: With patient Time For Goal Achievement: 06/10/21 Potential to Achieve Goals: Fair Progress towards PT goals: Progressing toward goals    Frequency    Min 3X/week      PT Plan Current plan remains appropriate    Co-evaluation   Reason for Co-Treatment: For patient/therapist safety   OT goals addressed during session: ADL's and self-care      AM-PAC PT "6 Clicks" Mobility   Outcome Measure  Help needed turning from your back to your side while in a flat bed without using bedrails?: A Lot Help needed moving from lying on your back to sitting on the side of a flat bed without using bedrails?: A Lot Help needed moving to and from a bed to a chair (including a wheelchair)?: A Lot Help needed standing up from a chair using your arms (e.g., wheelchair or bedside chair)?: A Lot Help needed to walk in hospital room?: Total Help needed climbing 3-5 steps with a railing? : Total 6 Click Score: 10    End of Session Equipment Utilized During Treatment: Gait belt Activity Tolerance: Patient tolerated treatment well Patient left: in bed;with call bell/phone within reach;with bed alarm set;with nursing/sitter in room Nurse Communication: Mobility status PT Visit Diagnosis: Difficulty in walking, not elsewhere classified (R26.2);History of falling (Z91.81);Pain Pain - Right/Left: Left Pain - part of body: Ankle and joints of foot     Time: 7035-0093 PT Time Calculation (min) (ACUTE ONLY): 11 min  Charges:  $Therapeutic Activity: 8-22 mins                     [place] Pager [phone] Office [phone]    Jerid Catherman 05/27/2021, 3:32 PM

## 2021-05-27 NOTE — Evaluation (Addendum)
Physical Therapy Evaluation Patient Details Name: Albert Rogers MRN: [id number] DOB: 01/12/1933 Today's Date: 05/27/2021  History of Present Illness  Pt s/p fall with L ankle fx and with hx of DM, prostate CA, macular degeneration and HOH  Clinical Impression  Pt admitted as above and presenting with functional mobility limitations 2* NWB L LE and difficulty complying, balance deficits, and post op pain.   This date, pt very cooperative but requiring increased time and significant assist for all basic mobility tasks.  Pt would benefit from follow up rehab at SNF level to maximize IND and safety prior to return home with limited assist.      Recommendations for follow up therapy are one component of a multi-disciplinary discharge planning process, led by the attending physician.  Recommendations may be updated based on patient status, additional functional criteria and insurance authorization.  Follow Up Recommendations Skilled nursing-short term rehab (<3 hours/day)    Assistance Recommended at Discharge Frequent or constant Supervision/Assistance  Patient can return home with the following       Equipment Recommendations None recommended by PT  Recommendations for Other Services       Functional Status Assessment       Precautions / Restrictions Precautions Precautions: Fall Restrictions Weight Bearing Restrictions: Yes LLE Weight Bearing: Non weight bearing      Mobility  Bed Mobility Overal bed mobility: Needs Assistance Bed Mobility: Supine to Sit     Supine to sit: Mod assist;+2 for physical assistance;+2 for safety/equipment     General bed mobility comments: Incresaed time with cues for sequence and use of R LE to self assist.  Physical assist to manage L LE, to control trunk and to complete rotation to EOB sitting with bed pad    Transfers Overall transfer level: Needs assistance Equipment used: Rolling walker (2 wheels) Transfers: Sit to/from Stand;Bed to  chair/wheelchair/BSC Sit to Stand: Max assist;+2 physical assistance;+2 safety/equipment;From elevated surface Stand pivot transfers: Max assist;+2 physical assistance;+2 safety/equipment;From elevated surface         General transfer comment: Assist to bring wt up and fwd, to balance in standing, and to rotate with RW to sit on BSC with assist to maintain NWB on L LE    Ambulation/Gait               General Gait Details: Pt at transfer level only this date  Stairs            Wheelchair Mobility    Modified Rankin (Stroke Patients Only)       Balance Overall balance assessment: Needs assistance Sitting-balance support: No upper extremity supported;Feet supported Sitting balance-Leahy Scale: Good     Standing balance support: Bilateral upper extremity supported Standing balance-Leahy Scale: Poor                               Pertinent Vitals/Pain Pain Assessment: Faces Faces Pain Scale: Hurts even more Pain Location: L ankle with dependent position Pain Descriptors / Indicators: Aching;Sore Pain Intervention(s): Limited activity within patient's tolerance;Monitored during session;Premedicated before session    Home Living Family/patient expects to be discharged to:: Skilled nursing facility Living Arrangements: Spouse/significant other                      Prior Function Prior Level of Function : Independent/Modified Independent             Mobility Comments: use of RW  Hand Dominance   Dominant Hand: Right    Extremity/Trunk Assessment   Upper Extremity Assessment Upper Extremity Assessment: Defer to OT evaluation    Lower Extremity Assessment Lower Extremity Assessment: LLE deficits/detail LLE Deficits / Details: L foot/ankle splinted - 3-/5 SLR    Cervical / Trunk Assessment Cervical / Trunk Assessment: Normal  Communication   Communication: HOH  Cognition Arousal/Alertness: Awake/alert Behavior During  Therapy: WFL for tasks assessed/performed Overall Cognitive Status: Within Functional Limits for tasks assessed                                          General Comments      Exercises     Assessment/Plan    PT Assessment Patient needs continued PT services  PT Problem List Decreased strength;Decreased range of motion;Decreased activity tolerance;Decreased balance;Decreased mobility;Decreased knowledge of use of DME;Decreased safety awareness;Pain       PT Treatment Interventions DME instruction;Gait training;Functional mobility training;Therapeutic activities;Therapeutic exercise;Balance training;Patient/family education    PT Goals (Current goals can be found in the Care Plan section)  Acute Rehab PT Goals Patient Stated Goal: Regain IND PT Goal Formulation: With patient Time For Goal Achievement: 06/10/21 Potential to Achieve Goals: Fair    Frequency Min 3X/week     Co-evaluation PT/OT/SLP Co-Evaluation/Treatment: Yes Reason for Co-Treatment: For patient/therapist safety PT goals addressed during session: Mobility/safety with mobility OT goals addressed during session: ADL's and self-care       AM-PAC PT "6 Clicks" Mobility  Outcome Measure Help needed turning from your back to your side while in a flat bed without using bedrails?: A Lot Help needed moving from lying on your back to sitting on the side of a flat bed without using bedrails?: A Lot Help needed moving to and from a bed to a chair (including a wheelchair)?: A Lot Help needed standing up from a chair using your arms (e.g., wheelchair or bedside chair)?: A Lot Help needed to walk in hospital room?: Total Help needed climbing 3-5 steps with a railing? : Total 6 Click Score: 10    End of Session Equipment Utilized During Treatment: Gait belt Activity Tolerance: Patient limited by pain;Patient tolerated treatment well Patient left: in chair;with call bell/phone within reach;with chair  alarm set;with family/visitor present Nurse Communication: Mobility status PT Visit Diagnosis: Difficulty in walking, not elsewhere classified (R26.2);History of falling (Z91.81);Pain Pain - Right/Left: Left Pain - part of body: Ankle and joints of foot   TIME: 5625-6389 PT Time Calculation (min) (ACUTE ONLY): 41 min   Charges:   PT Evaluation $PT Eval Low Complexity: 1 Low PT Treatments $Therapeutic Activity: 8-22 mins        Debe Coder PT Acute Rehabilitation Services Pager [phone] Office [phone]   Anniemae Haberkorn 05/27/2021, 11:31 AM

## 2021-05-27 NOTE — Evaluation (Signed)
Occupational Therapy Evaluation Patient Details Name: Albert Rogers MRN: [id number] DOB: [birth year] Today's Date: 05/27/2021   History of Present Illness Pt s/p fall with L ankle fx and with hx of DM, prostate CA, macular degeneration and HOH   Clinical Impression   Pt admitted with the above diagnoses and presents with below problem list. Pt will benefit from continued acute OT to address the below listed deficits and maximize independence with basic ADLs prior to d/c to venue below. At baseline pt is mod I with basic ADLs, utilizes walker. Pt currently needs +3 assist to stand and pivot to access BSC. Pt needs total A to hold LLE in NWB position in standing. Pt will need ST SNF at d/c for continued rehab prior to d/c home.        Recommendations for follow up therapy are one component of a multi-disciplinary discharge planning process, led by the attending physician.  Recommendations may be updated based on patient status, additional functional criteria and insurance authorization.   Follow Up Recommendations  Skilled nursing-short term rehab (<3 hours/day)    Assistance Recommended at Discharge Frequent or constant Supervision/Assistance  Patient can return home with the following      Functional Status Assessment     Equipment Recommendations  Other (comment) (defer to next venue)    Recommendations for Other Services       Precautions / Restrictions Precautions Precautions: Fall Restrictions Weight Bearing Restrictions: Yes LLE Weight Bearing: Non weight bearing      Mobility Bed Mobility Overal bed mobility: Needs Assistance Bed Mobility: Supine to Sit     Supine to sit: Mod assist;+2 for physical assistance;+2 for safety/equipment     General bed mobility comments: Incresaed time with cues for sequence and use of R LE to self assist.  Physical assist to manage L LE, to control trunk and to complete rotation to EOB sitting with bed pad    Transfers Overall  transfer level: Needs assistance Equipment used: Rolling walker (2 wheels) Transfers: Sit to/from Stand;Bed to chair/wheelchair/BSC Sit to Stand: Max assist;+2 physical assistance;+2 safety/equipment;From elevated surface Stand pivot transfers: Max assist;+2 physical assistance;+2 safety/equipment;From elevated surface         General transfer comment: Assist to bring wt up and fwd, to balance in standing, and to rotate with RW to sit on BSC with assist to maintain NWB on L LE      Balance Overall balance assessment: Needs assistance Sitting-balance support: No upper extremity supported;Feet supported Sitting balance-Leahy Scale: Good     Standing balance support: Bilateral upper extremity supported Standing balance-Leahy Scale: Zero Standing balance comment: +2 in standing to steady, 3rd person to hold LLE in NWB position                           ADL either performed or assessed with clinical judgement   ADL Overall ADL's : Needs assistance/impaired Eating/Feeding: Set up;Bed level   Grooming: Set up;Bed level   Upper Body Bathing: Moderate assistance;Bed level   Lower Body Bathing: +2 for physical assistance;Maximal assistance;Sit to/from stand;Sitting/lateral leans   Upper Body Dressing : Moderate assistance;Bed level   Lower Body Dressing: Maximal assistance;Total assistance;+2 for physical assistance;Sitting/lateral leans;Sit to/from stand   Toilet Transfer: Maximal assistance;+2 for physical assistance;Stand-pivot;BSC/3in1;Rolling walker (2 wheels) Toilet Transfer Details (indicate cue type and reason): third person to physically maintain LLE NWB status           General ADL Comments: +  2 assist with bed mobility and +3 assist to stand. fair sitting balance.     Vision Baseline Vision/History: 6 Macular Degeneration       Perception     Praxis      Pertinent Vitals/Pain Pain Assessment: Faces Faces Pain Scale: Hurts even more Pain Location:  L ankle with dependent position Pain Descriptors / Indicators: Aching;Sore Pain Intervention(s): Limited activity within patient's tolerance;Monitored during session;Premedicated before session;Repositioned     Hand Dominance Right   Extremity/Trunk Assessment Upper Extremity Assessment Upper Extremity Assessment: Generalized weakness   Lower Extremity Assessment Lower Extremity Assessment: Defer to PT evaluation LLE Deficits / Details: L foot/ankle splinted - 3-/5 SLR   Cervical / Trunk Assessment Cervical / Trunk Assessment: Normal   Communication Communication Communication: HOH   Cognition Arousal/Alertness: Awake/alert Behavior During Therapy: WFL for tasks assessed/performed Overall Cognitive Status: Within Functional Limits for tasks assessed                                 General Comments: difficulty dual tasking. physical assist needed to maintain LLE NWB     General Comments  Daughter present during transfers, second half of session. Pt on RA throughout session with SpO2 >92.    Exercises Exercises: Other exercises Other Exercises Other Exercises: BUE general strengthining (chair pushups, AROM with light weights (ie water bottles) discussed with daugther   Shoulder Instructions      Home Living Family/patient expects to be discharged to:: Skilled nursing facility Living Arrangements: Spouse/significant other                                      Prior Functioning/Environment Prior Level of Function : Independent/Modified Independent             Mobility Comments: use of RW          OT Problem List: Decreased strength;Decreased activity tolerance;Impaired balance (sitting and/or standing);Decreased knowledge of use of DME or AE;Decreased knowledge of precautions;Pain      OT Treatment/Interventions: Self-care/ADL training;Therapeutic exercise;DME and/or AE instruction;Therapeutic activities;Balance  training;Patient/family education    OT Goals(Current goals can be found in the care plan section) Acute Rehab OT Goals Patient Stated Goal: go to [facility] for rehab OT Goal Formulation: With patient/family Time For Goal Achievement: 06/10/21 Potential to Achieve Goals: Good ADL Goals Pt Will Perform Grooming: with set-up;sitting Pt Will Perform Upper Body Dressing: with set-up;sitting Pt Will Perform Lower Body Dressing: sitting/lateral leans;with mod assist Pt Will Transfer to Toilet: with mod assist;stand pivot transfer;with +2 assist;bedside commode Pt Will Perform Toileting - Clothing Manipulation and hygiene: with mod assist;sit to/from stand Pt/caregiver will Perform Home Exercise Program: Increased strength;Both right and left upper extremity;With theraband;With written HEP provided;Independently Additional ADL Goal #1: Pt will complete bed mobility at mod A level to prepare for EOB/OOB ADLs.  OT Frequency: Min 2X/week    Co-evaluation PT/OT/SLP Co-Evaluation/Treatment: Yes Reason for Co-Treatment: For patient/therapist safety PT goals addressed during session: Mobility/safety with mobility OT goals addressed during session: ADL's and self-care      AM-PAC OT "6 Clicks" Daily Activity     Outcome Measure Help from another person eating meals?: None Help from another person taking care of personal grooming?: A Little Help from another person toileting, which includes using toliet, bedpan, or urinal?: Total Help from another person bathing (including washing, rinsing,  drying)?: Total Help from another person to put on and taking off regular upper body clothing?: A Little Help from another person to put on and taking off regular lower body clothing?: Total 6 Click Score: 13   End of Session Equipment Utilized During Treatment: Rolling walker (2 wheels);Gait belt Nurse Communication: Mobility status  Activity Tolerance: Patient limited by pain;Patient limited by fatigue;Patient  tolerated treatment well Patient left: in chair;with call bell/phone within reach;with chair alarm set  OT Visit Diagnosis: Unsteadiness on feet (R26.81);Muscle weakness (generalized) (M62.81);History of falling (Z91.81);Low vision, both eyes (H54.2);Pain Pain - Right/Left: Left Pain - part of body: Ankle and joints of foot;Leg;Knee                Time: 5638-7564 OT Time Calculation (min): 41 min Charges:  OT General Charges $OT Visit: 1 Visit OT Evaluation $OT Eval Moderate Complexity: [place], OT Acute Rehabilitation Services Office: [phone]   Hortencia Pilar 05/27/2021, 12:32 PM

## 2021-05-27 NOTE — Progress Notes (Signed)
PROGRESS NOTE    [name]  ELF:[id number] DOB: 1932/09/08 DOA: 05/25/2021 PCP: Alroy Dust, L.Marlou Sa, MD  Chief Complaint  Patient presents with   Fall   Ankle Pain    Brief Narrative:  Albert Rogers is Albert Rogers 88 y.o. male with medical history significant of DM2, HLD, HTN. Presenting with left ankle pain. He report that he was walking out from his house down [name] ramp. It was slick and so he slipped. He felt to his left and rolled his ankle. He did not hit his head. He remembers the entire fall. There was no LOC. He tried to get up, but he couldn't put weight on his left foot. He crawled to the base of the ramp and called for EMS. He denies any other aggravating or alleviating factors.    ED Course: He was noted to have Jupiter Kabir left trimalleolar ankle fracture. He was placed in Dakotah Heiman splint. Orthopedics was consulted. [facility] was called for admission.     Assessment & Plan:   Principal Problem:   Ankle fracture Active Problems:   SIRS (systemic inflammatory response syndrome) (HCC)   Type 2 diabetes mellitus without complication (HCC)   Dyslipidemia   Benign essential hypertension   Thrombocytopenia (HCC)   Hard of hearing   Macular degeneration   * Ankle fracture- (present on admission) Trimalleolar fx with dislocation at tibiotalar joint No s/o L distal fibula open reduction internal fixation, left medial malleolus open reduction internal fixation, left open syndesmosis reduction and internal fixation on 1/11 with orthopedic Strict NWB operative extremity, max elevation - NWB 2-3 months, maintain short leg splint DVT ppx on POD1 PT/OT 1 week f/u for wound check Sutures outin 3 weeks with exchange of short leg splint to short leg cast  SIRS (systemic inflammatory response syndrome) ([place]) Post op fever, tachycardia CXR with developing infiltrate R base, suspect aspiration pneumonia He's improving, will transition to oral abx Blood cx pending UA 1/10 not c/w UTI   Type 2 diabetes  mellitus without complication ([place]) SSI On metformin at home  Dyslipidemia- (present on admission) crestor  Thrombocytopenia ([place]) Trend, noted, some degree of chronicity  Benign essential hypertension- (present on admission) lisinopril  Macular degeneration At risk for delirium  Hard of hearing At risk for delirium   DVT prophylaxis: lovenox Code Status: DNR Family Communication: daughter Disposition:   Status is: Inpatient  Remains inpatient appropriate because: therapy, placement       Consultants:  orthopedics  Procedures:  PROCEDURE: Left distal fibula open reduction internal fixation Left medial malleolus open reduction internal fixation Left open syndesmosis reduction and internal fixation  Antimicrobials:  Anti-infectives (From admission, onward)    Start     Dose/Rate Route Frequency Ordered Stop   05/27/21 2200  amoxicillin-clavulanate (AUGMENTIN) 875-125 MG per tablet 1 tablet        1 tablet Oral Every 12 hours 05/27/21 1942 05/31/21 2159   05/26/21 2315  ceFAZolin (ANCEF) IVPB 1 g/50 mL premix        1 g 100 mL/hr over 30 Minutes Intravenous Every 8 hours 05/26/21 2223 05/27/21 0710   05/26/21 2100  cefTRIAXone (ROCEPHIN) 1 g in sodium chloride 0.9 % 100 mL IVPB  Status:  Discontinued        1 g 200 mL/hr over 30 Minutes Intravenous Every 24 hours 05/26/21 2020 05/27/21 1942   05/26/21 2100  metroNIDAZOLE (FLAGYL) IVPB 500 mg  Status:  Discontinued        500 mg 100 mL/hr  over 60 Minutes Intravenous Every 12 hours 05/26/21 2020 05/27/21 1942   05/26/21 1458  vancomycin (VANCOCIN) powder  Status:  Discontinued          As needed 05/26/21 1458 05/26/21 1917   05/26/21 0600  ceFAZolin (ANCEF) IVPB 2g/100 mL premix        2 g 200 mL/hr over 30 Minutes Intravenous On call to O.R. 05/25/21 1601 05/26/21 1412       Subjective: Feels better than yesterday  Objective: Vitals:   05/26/21 2125 05/27/21 0136 05/27/21 0553 05/27/21 1348  BP:  115/71 102/65 133/78 109/60  Pulse: (!) 102 90 95 (!) 43  Resp: 20 18 16 20   Temp: 98 F (36.7 C) 98.6 F (37 C) 98.1 F (36.7 C) 97.8 F (36.6 C)  TempSrc: Oral Oral Oral Oral  SpO2: 96% 99% 97%   Weight:      Height:        Intake/Output Summary (Last 24 hours) at 05/27/2021 1946 Last data filed at 05/27/2021 1731 Gross per 24 hour  Intake 400 ml  Output 950 ml  Net -550 ml   Filed Weights   05/25/21 1026  Weight: 87.5 kg    Examination:  General: No acute distress. Sitting up eating lunch in no distress Cardiovascular: RRR Lungs: unlabored on RA Neurological: Alert and oriented 3. Moves all extremities 4 . Cranial nerves II through XII grossly intact. Skin: Warm and dry. No rashes or lesions. Extremities: LLE in splint     Data Reviewed: I have personally reviewed following labs and imaging studies  CBC: Recent Labs  Lab 05/25/21 1222 05/26/21 0251 05/26/21 1958 05/27/21 0307  WBC 6.9 5.0 7.7 4.9  NEUTROABS 5.5  --  6.6 3.6  HGB 12.2* 11.2* 10.4* 9.9*  HCT 38.0* 34.7* 32.1* 30.9*  MCV 89.8 88.7 88.9 90.4  PLT 131* 105* 109* 95*    Basic Metabolic Panel: Recent Labs  Lab 05/25/21 1222 05/26/21 0251 05/26/21 1958 05/27/21 0307  NA 136 133* 131* 134*  K 3.5 4.1 4.2 3.9  CL 102 100 100 101  CO2 27 23 22 24   GLUCOSE 143* 163* 181* 145*  BUN 15 15 15 17   CREATININE 0.67 0.75 0.88 0.79  CALCIUM 8.6* 8.5* 8.0* 7.8*  MG  --   --   --  2.0  PHOS  --   --   --  4.2    GFR: Estimated Creatinine Clearance: 68.4 mL/min (by C-G formula based on SCr of 0.79 mg/dL).  Liver Function Tests: Recent Labs  Lab 05/26/21 0251 05/27/21 0307  AST 25 20  ALT 18 14  ALKPHOS 62 47  BILITOT 1.1 0.5  PROT 6.3* 5.8*  ALBUMIN 3.4* 3.0*    CBG: Recent Labs  Lab 05/26/21 1727 05/26/21 2227 05/27/21 0724 05/27/21 1120 05/27/21 1606  GLUCAP 172* 133* 150* 145* 120*     Recent Results (from the past 240 hour(s))  Resp Panel by RT-PCR (Flu Brayson Livesey&B,  Covid) Nasopharyngeal Swab     Status: None   Collection Time: 05/25/21 12:22 PM   Specimen: Nasopharyngeal Swab; Nasopharyngeal(NP) swabs in vial transport medium  Result Value Ref Range Status   SARS Coronavirus 2 by RT PCR NEGATIVE NEGATIVE Final    Comment: (NOTE) SARS-CoV-2 target nucleic acids are NOT DETECTED.  The SARS-CoV-2 RNA is generally detectable in upper respiratory specimens during the acute phase of infection. The lowest concentration of SARS-CoV-2 viral copies this assay can detect is 138 copies/mL. Lupe Bonner negative result  does not preclude SARS-Cov-2 infection and should not be used as the sole basis for treatment or other patient management decisions. Serinity Ware negative result may occur with  improper specimen collection/handling, submission of specimen other than nasopharyngeal swab, presence of viral mutation(s) within the areas targeted by this assay, and inadequate number of viral copies(<138 copies/mL). Satin Boal negative result must be combined with clinical observations, patient history, and epidemiological information. The expected result is Negative.  Fact Sheet for Patients:  EntrepreneurPulse.com.au  Fact Sheet for Healthcare Providers:  IncredibleEmployment.be  This test is no t yet approved or cleared by the [place] FDA and  has been authorized for detection and/or diagnosis of SARS-CoV-2 by FDA under an Emergency Use Authorization (EUA). This EUA will remain  in effect (meaning this test can be used) for the duration of the COVID-19 declaration under Section 564(b)(1) of the Act, 21 U.S.C.section 360bbb-3(b)(1), unless the authorization is terminated  or revoked sooner.       Influenza Rolfe Hartsell by PCR NEGATIVE NEGATIVE Final   Influenza B by PCR NEGATIVE NEGATIVE Final    Comment: (NOTE) The Xpert Xpress SARS-CoV-2/FLU/RSV plus assay is intended as an aid in the diagnosis of influenza from Nasopharyngeal swab specimens and should  not be used as Monnica Saltsman sole basis for treatment. Nasal washings and aspirates are unacceptable for Xpert Xpress SARS-CoV-2/FLU/RSV testing.  Fact Sheet for Patients: EntrepreneurPulse.com.au  Fact Sheet for Healthcare Providers: IncredibleEmployment.be  This test is not yet approved or cleared by the [place] FDA and has been authorized for detection and/or diagnosis of SARS-CoV-2 by FDA under an Emergency Use Authorization (EUA). This EUA will remain in effect (meaning this test can be used) for the duration of the COVID-19 declaration under Section 564(b)(1) of the Act, 21 U.S.C. section 360bbb-3(b)(1), unless the authorization is terminated or revoked.  Performed at [facility], [place] [address], [place], [place] [address]   Surgical PCR screen     Status: None   Collection Time: 05/26/21  6:25 AM   Specimen: Nasal Mucosa; Nasal Swab  Result Value Ref Range Status   MRSA, PCR NEGATIVE NEGATIVE Final   Staphylococcus aureus NEGATIVE NEGATIVE Final    Comment: (NOTE) The Xpert SA Assay (FDA approved for NASAL specimens in patients 31 years of age and older), is one component of Kristofer Schaffert comprehensive surveillance program. It is not intended to diagnose infection nor to guide or monitor treatment. Performed at [facility], [place] [address] [place], [place] [address]   Culture, blood (routine x 2)     Status: None (Preliminary result)   Collection Time: 05/26/21  7:58 PM   Specimen: BLOOD  Result Value Ref Range Status   Specimen Description   Final    BLOOD LEFT ANTECUBITAL Performed at [place] [address] [place], [place] [address]    Special Requests   Final    BOTTLES DRAWN AEROBIC ONLY Blood Culture results may not be optimal due to an inadequate volume of blood received in culture bottles Performed at [place] [address] [place], [place]  [address]    Culture   Final    NO GROWTH < 24 HOURS Performed at [place] [address], [place], [place] [address]    Report Status PENDING  Incomplete  Culture, blood (routine x 2)     Status: None (Preliminary result)   Collection Time: 05/26/21  7:58 PM   Specimen: BLOOD  Result Value  Ref Range Status   Specimen Description   Final    BLOOD RIGHT ANTECUBITAL Performed at [place] [address] [place], [place] [address]    Special Requests   Final    BOTTLES DRAWN AEROBIC ONLY Blood Culture adequate volume Performed at [place] [address], [place], [place] [address]    Culture   Final    NO GROWTH < 24 HOURS Performed at [place] [address] [place], [place] [address]    Report Status PENDING  Incomplete         Radiology Studies: DG Ankle 2 Views Left  Result Date: 05/26/2021 CLINICAL DATA:  Operative fixation of Adolfo Granieri left ankle trimalleolar fracture. EXAM: LEFT ANKLE - 2 VIEW COMPARISON:  05/25/2021 FINDINGS: Six C-arm views of the left ankle demonstrate screw and plate fixation of the previously demonstrated medial and lateral malleolus fractures. Improved position and alignment of the posterior malleolus fracture on these views. IMPRESSION: Hardware fixation of the previously demonstrated medial and lateral malleolus fractures with improved position and alignment of the previously demonstrated posterior malleolus fracture. Electronically Signed   By: Claudie Revering M.D.   On: 05/26/2021 17:02   DG CHEST PORT 1 VIEW  Result Date: 05/26/2021 CLINICAL DATA:  Fever EXAM: PORTABLE CHEST 1 VIEW COMPARISON:  07/16/2020, CT 12/29/2010 FINDINGS: Mild diffuse reticular interstitial opacity. Moderate hiatal hernia. Increased airspace opacity at right base. No pleural effusion or pneumothorax. Aortic atherosclerosis IMPRESSION: Increasing atelectasis or developing infiltrate right base. Moderate hiatal hernia  Electronically Signed   By: Donavan Foil M.D.   On: 05/26/2021 20:50   DG C-Arm 1-60 Min-No Report  Result Date: 05/26/2021 Fluoroscopy was utilized by the requesting physician.  No radiographic interpretation.   DG C-Arm 1-60 Min-No Report  Result Date: 05/26/2021 Fluoroscopy was utilized by the requesting physician.  No radiographic interpretation.   DG C-Arm 1-60 Min-No Report  Result Date: 05/26/2021 Fluoroscopy was utilized by the requesting physician.  No radiographic interpretation.        Scheduled Meds:  amoxicillin-clavulanate  1 tablet Oral Q12H   enoxaparin (LOVENOX) injection  40 mg Subcutaneous Q24H   insulin aspart  0-15 Units Subcutaneous TID WC   insulin aspart  0-5 Units Subcutaneous QHS   lisinopril  10 mg Oral Daily   multivitamin  1 tablet Oral Daily   rosuvastatin  10 mg Oral Once per day on Mon Wed Fri   Continuous Infusions:  lactated ringers       LOS: 2 days    Time spent: over 30 min    Fayrene Helper, MD [facility]   To contact the attending provider between 7A-7P or the covering provider during after hours 7P-7A, please log into the web site [email or web address] and access using universal [place] password for that web site. If you do not have the password, please call the hospital operator.  05/27/2021, 7:46 PM

## 2021-05-27 NOTE — Plan of Care (Signed)
Plan of care reviewed and discussed with the patient and his daughter.

## 2021-05-27 NOTE — Progress Notes (Signed)
Subjective: 1 Day Post-Op Procedure(s) (LRB): OPEN REDUCTION INTERNAL FIXATION (ORIF) ANKLE FRACTURE (Left)  Patient reports pain as mild to moderate. He is accompanied by his daughter again today at bedside. He is able to control pain quite well with medications.   Objective:   VITALS:  Temp:  [98 F (36.7 C)-100.7 F (38.2 C)] 98.1 F (36.7 C) (01/12 0553) Pulse Rate:  [90-119] 95 (01/12 0553) Resp:  [12-30] 16 (01/12 0553) BP: (102-181)/(65-128) 133/78 (01/12 0553) SpO2:  [92 %-100 %] 97 % (01/12 0553)  Gen: AAOx3, NAD  Left Lower Extremity: Well padded short leg splint in place Wiggles toes SILT over toes CR<2s    LABS Recent Labs    05/25/21 1222 05/26/21 0251 05/26/21 1958 05/27/21 0307  HGB 12.2* 11.2* 10.4* 9.9*  WBC 6.9 5.0 7.7 4.9  PLT 131* 105* 109* 95*   Recent Labs    05/26/21 1958 05/27/21 0307  NA 131* 134*  K 4.2 3.9  CL 100 101  CO2 22 24  BUN 15 17  CREATININE 0.88 0.79  GLUCOSE 181* 145*   Recent Labs    05/25/21 1512  INR 1.0     Assessment/Plan: 1 Day Post-Op Procedure(s) (LRB): OPEN REDUCTION INTERNAL FIXATION (ORIF) ANKLE FRACTURE (Left)  -strict NWB operative extremity, maximum elevation. Plan is to maintain NWB for 2-3 months given his diabetes and severe injury pattern with fracture dislocation -maintain short leg splint until follow up -pain meds per primary team -DVT ppx per primary team preference to begin POD1 -physical therapy to assist with NWB restrictions training -anticipate discharge to SNF/inpatient rehab -plan for follow up in 1 week as outpatient for wound check -sutures out in 3 weeks with exchange of short leg splint to short leg cast in outpatient office  Armond Hang 05/27/2021, 11:56 AM

## 2021-05-27 NOTE — Progress Notes (Signed)
Physical Therapy Treatment Patient Details Name: Albert Rogers MRN: [id number] DOB: [birth year] Today's Date: 05/27/2021   History of Present Illness Pt s/p fall with L ankle fx and with hx of DM, prostate CA, macular degeneration and HOH    PT Comments    Pt continues cooperative but mildly impulsive.  Pt assisted to stand and pvt with RW from recliner to [facility] with third person assist to insure NWB on L LE.  Pt states "I'm going to need some time here"   Recommendations for follow up therapy are one component of a multi-disciplinary discharge planning process, led by the attending physician.  Recommendations may be updated based on patient status, additional functional criteria and insurance authorization.  Follow Up Recommendations  Skilled nursing-short term rehab (<3 hours/day)     Assistance Recommended at Discharge Frequent or constant Supervision/Assistance  Patient can return home with the following Two people to help with walking and/or transfers;Two people to help with bathing/dressing/bathroom;A lot of help with bathing/dressing/bathroom;Assistance with cooking/housework;Assist for transportation;Help with stairs or ramp for entrance   Equipment Recommendations  None recommended by PT    Recommendations for Other Services       Precautions / Restrictions Precautions Precautions: Fall Restrictions Weight Bearing Restrictions: Yes LLE Weight Bearing: Non weight bearing     Mobility  Bed Mobility Overal bed mobility: Needs Assistance Bed Mobility: Supine to Sit     Supine to sit: Mod assist;+2 for physical assistance;+2 for safety/equipment     General bed mobility comments: Pt up from recliner onto [facility]    Transfers Overall transfer level: Needs assistance Equipment used: Rolling walker (2 wheels) Transfers: Sit to/from Stand;Bed to chair/wheelchair/BSC Sit to Stand: Max assist;+2 physical assistance;+2 safety/equipment;From elevated surface Stand pivot  transfers: Max assist;+2 physical assistance;+2 safety/equipment;From elevated surface         General transfer comment: Assist to bring wt up and fwd, to balance in standing, and to rotate with RW to sit on BSC with assist to maintain NWB on L LE    Ambulation/Gait               General Gait Details: Pt at transfer level only this date   Stairs             Wheelchair Mobility    Modified Rankin (Stroke Patients Only)       Balance Overall balance assessment: Needs assistance Sitting-balance support: No upper extremity supported;Feet supported Sitting balance-Leahy Scale: Good     Standing balance support: Bilateral upper extremity supported Standing balance-Leahy Scale: Zero Standing balance comment: +2 in standing to steady, 3rd person to hold LLE in NWB position                            Cognition Arousal/Alertness: Awake/alert Behavior During Therapy: WFL for tasks assessed/performed Overall Cognitive Status: Within Functional Limits for tasks assessed                                 General Comments: difficulty dual tasking. physical assist needed to maintain LLE NWB        Exercises Other Exercises Other Exercises: BUE general strengthining (chair pushups, AROM with light weights (ie water bottles) discussed with daugther    General Comments General comments (skin integrity, edema, etc.): Daughter present during transfers, second half of session. Pt on RA throughout session with SpO2 >92.  Pertinent Vitals/Pain Pain Assessment: Faces Faces Pain Scale: Hurts little more Pain Location: L ankle with dependent position Pain Descriptors / Indicators: Aching;Sore Pain Intervention(s): Limited activity within patient's tolerance;Monitored during session    Home Living Family/patient expects to be discharged to:: Skilled nursing facility Living Arrangements: Spouse/significant other                       Prior Function            PT Goals (current goals can now be found in the care plan section) Acute Rehab PT Goals Patient Stated Goal: Regain IND PT Goal Formulation: With patient Time For Goal Achievement: 06/10/21 Potential to Achieve Goals: Fair Progress towards PT goals: Progressing toward goals    Frequency    Min 3X/week      PT Plan Current plan remains appropriate    Co-evaluation   Reason for Co-Treatment: For patient/therapist safety   OT goals addressed during session: ADL's and self-care      AM-PAC PT "6 Clicks" Mobility   Outcome Measure  Help needed turning from your back to your side while in a flat bed without using bedrails?: A Lot Help needed moving from lying on your back to sitting on the side of a flat bed without using bedrails?: A Lot Help needed moving to and from a bed to a chair (including a wheelchair)?: A Lot Help needed standing up from a chair using your arms (e.g., wheelchair or bedside chair)?: A Lot Help needed to walk in hospital room?: Total Help needed climbing 3-5 steps with a railing? : Total 6 Click Score: 10    End of Session Equipment Utilized During Treatment: Gait belt Activity Tolerance: Patient tolerated treatment well Patient left: Other (comment) [facility]) Nurse Communication: Mobility status PT Visit Diagnosis: Difficulty in walking, not elsewhere classified (R26.2);History of falling (Z91.81);Pain Pain - Right/Left: Left Pain - part of body: Ankle and joints of foot     Time: 4970-2637 PT Time Calculation (min) (ACUTE ONLY): 11 min  Charges:  $Therapeutic Activity: 8-22 mins                     [place] Pager [phone] Office [phone]    Jenene Kauffmann 05/27/2021, 3:26 PM

## 2021-05-27 NOTE — Plan of Care (Signed)
?  Problem: Nutrition: ?Goal: Adequate nutrition will be maintained ?Outcome: Progressing ?  ?Problem: Elimination: ?Goal: Will not experience complications related to bowel motility ?Outcome: Progressing ?Goal: Will not experience complications related to urinary retention ?Outcome: Progressing ?  ?Problem: Pain Managment: ?Goal: General experience of comfort will improve ?Outcome: Progressing ?  ?  ?

## 2021-05-28 ENCOUNTER — Inpatient Hospital Stay (HOSPITAL_COMMUNITY): Payer: Medicare Other

## 2021-05-28 DIAGNOSIS — D696 Thrombocytopenia, unspecified: Secondary | ICD-10-CM | POA: Diagnosis not present

## 2021-05-28 DIAGNOSIS — E119 Type 2 diabetes mellitus without complications: Secondary | ICD-10-CM | POA: Diagnosis not present

## 2021-05-28 DIAGNOSIS — J690 Pneumonitis due to inhalation of food and vomit: Secondary | ICD-10-CM | POA: Diagnosis not present

## 2021-05-28 DIAGNOSIS — M25572 Pain in left ankle and joints of left foot: Secondary | ICD-10-CM | POA: Diagnosis not present

## 2021-05-28 DIAGNOSIS — Z9889 Other specified postprocedural states: Secondary | ICD-10-CM | POA: Diagnosis not present

## 2021-05-28 DIAGNOSIS — H353221 Exudative age-related macular degeneration, left eye, with active choroidal neovascularization: Secondary | ICD-10-CM | POA: Diagnosis not present

## 2021-05-28 DIAGNOSIS — S82892A Other fracture of left lower leg, initial encounter for closed fracture: Secondary | ICD-10-CM | POA: Diagnosis not present

## 2021-05-28 DIAGNOSIS — H3530 Unspecified macular degeneration: Secondary | ICD-10-CM | POA: Diagnosis not present

## 2021-05-28 DIAGNOSIS — H353290 Exudative age-related macular degeneration, unspecified eye, stage unspecified: Secondary | ICD-10-CM | POA: Diagnosis not present

## 2021-05-28 DIAGNOSIS — H9190 Unspecified hearing loss, unspecified ear: Secondary | ICD-10-CM | POA: Diagnosis not present

## 2021-05-28 DIAGNOSIS — H353211 Exudative age-related macular degeneration, right eye, with active choroidal neovascularization: Secondary | ICD-10-CM | POA: Diagnosis not present

## 2021-05-28 DIAGNOSIS — R54 Age-related physical debility: Secondary | ICD-10-CM | POA: Diagnosis not present

## 2021-05-28 DIAGNOSIS — E1142 Type 2 diabetes mellitus with diabetic polyneuropathy: Secondary | ICD-10-CM | POA: Diagnosis not present

## 2021-05-28 DIAGNOSIS — K229 Disease of esophagus, unspecified: Secondary | ICD-10-CM

## 2021-05-28 DIAGNOSIS — C61 Malignant neoplasm of prostate: Secondary | ICD-10-CM | POA: Diagnosis not present

## 2021-05-28 DIAGNOSIS — F0390 Unspecified dementia without behavioral disturbance: Secondary | ICD-10-CM | POA: Diagnosis not present

## 2021-05-28 DIAGNOSIS — E785 Hyperlipidemia, unspecified: Secondary | ICD-10-CM | POA: Diagnosis not present

## 2021-05-28 DIAGNOSIS — I10 Essential (primary) hypertension: Secondary | ICD-10-CM | POA: Diagnosis not present

## 2021-05-28 DIAGNOSIS — S82852A Displaced trimalleolar fracture of left lower leg, initial encounter for closed fracture: Secondary | ICD-10-CM | POA: Diagnosis not present

## 2021-05-28 DIAGNOSIS — W19XXXD Unspecified fall, subsequent encounter: Secondary | ICD-10-CM | POA: Diagnosis not present

## 2021-05-28 DIAGNOSIS — Z4789 Encounter for other orthopedic aftercare: Secondary | ICD-10-CM | POA: Diagnosis not present

## 2021-05-28 DIAGNOSIS — S82852D Displaced trimalleolar fracture of left lower leg, subsequent encounter for closed fracture with routine healing: Secondary | ICD-10-CM | POA: Diagnosis not present

## 2021-05-28 LAB — CBC
HCT: 30 % — ABNORMAL LOW (ref 39.0–52.0)
Hemoglobin: 9.7 g/dL — ABNORMAL LOW (ref 13.0–17.0)
MCH: 29.3 pg (ref 26.0–34.0)
MCHC: 32.3 g/dL (ref 30.0–36.0)
MCV: 90.6 fL (ref 80.0–100.0)
Platelets: 105 10*3/uL — ABNORMAL LOW (ref 150–400)
RBC: 3.31 MIL/uL — ABNORMAL LOW (ref 4.22–5.81)
RDW: 14.6 % (ref 11.5–15.5)
WBC: 5.3 10*3/uL (ref 4.0–10.5)
nRBC: 0 % (ref 0.0–0.2)

## 2021-05-28 LAB — GLUCOSE, CAPILLARY
Glucose-Capillary: 126 mg/dL — ABNORMAL HIGH (ref 70–99)
Glucose-Capillary: 194 mg/dL — ABNORMAL HIGH (ref 70–99)
Glucose-Capillary: 132 mg/dL — ABNORMAL HIGH (ref 70–99)

## 2021-05-28 LAB — PHOSPHORUS: Phosphorus: 2.5 mg/dL (ref 2.5–4.6)

## 2021-05-28 LAB — BASIC METABOLIC PANEL
Anion gap: 9 (ref 5–15)
BUN: 19 mg/dL (ref 8–23)
CO2: 25 mmol/L (ref 22–32)
Calcium: 7.9 mg/dL — ABNORMAL LOW (ref 8.9–10.3)
Chloride: 98 mmol/L (ref 98–111)
Creatinine, Ser: 0.68 mg/dL (ref 0.61–1.24)
GFR, Estimated: >60 mL/min (ref >60)
Glucose, Bld: 134 mg/dL — ABNORMAL HIGH (ref 70–99)
Potassium: 4 mmol/L (ref 3.5–5.1)
Sodium: 132 mmol/L — ABNORMAL LOW (ref 135–145)

## 2021-05-28 LAB — RESP PANEL BY RT-PCR (FLU A&B, COVID) ARPGX2
Influenza A by PCR: NEGATIVE
Influenza B by PCR: NEGATIVE
SARS Coronavirus 2 by RT PCR: NEGATIVE

## 2021-05-28 LAB — MAGNESIUM: Magnesium: 2.2 mg/dL (ref 1.7–2.4)

## 2021-05-28 MED ORDER — AMOXICILLIN-POT CLAVULANATE 875-125 MG PO TABS: 1.0000 | ORAL_TABLET | Freq: Two times a day (BID) | ORAL | 0 refills | Status: AC

## 2021-05-28 MED ORDER — ENOXAPARIN SODIUM 40 MG/0.4ML IJ SOSY: 40.0000 mg | PREFILLED_SYRINGE | INTRAMUSCULAR | 0 refills | Status: AC

## 2021-05-28 MED ORDER — HYDROCODONE-ACETAMINOPHEN 5-325 MG PO TABS: 0.5000 | ORAL_TABLET | Freq: Four times a day (QID) | ORAL | 0 refills | Status: AC | PRN

## 2021-05-28 MED ORDER — ACETAMINOPHEN 325 MG PO TABS: 650.0000 mg | ORAL_TABLET | ORAL | 2 refills | Status: AC | PRN

## 2021-05-28 NOTE — Evaluation (Signed)
Clinical/Bedside Swallow Evaluation Patient Details  Name: Albert Rogers MRN: [id number] Date of Birth: Mar 26, 1933  Today's Date: 05/28/2021 Time: SLP Start Time (ACUTE ONLY): 0950 SLP Stop Time (ACUTE ONLY): 1020 SLP Time Calculation (min) (ACUTE ONLY): 30 min  Past Medical History:  Past Medical History:  Diagnosis Date   Diabetes ([place])    Macular degeneration    Prostate cancer [facility])    Past Surgical History:  Past Surgical History:  Procedure Laterality Date   ORIF ANKLE FRACTURE Left 05/26/2021   Procedure: OPEN REDUCTION INTERNAL FIXATION (ORIF) ANKLE FRACTURE;  Surgeon: Armond Hang, MD;  Location: [facility];  Service: Orthopedics;  Laterality: Left;   HPI:  88yo male admitted 05/25/21 with left ankle pain after a fall. Surgery 05/26/21 PMH: DM2, HLD, HTN, macular degeneration, prostate cancer. CXR = increasing atelectasis or developing infiltrate RLL, moderate hiatal hernia    Assessment / Plan / Recommendation  Clinical Impression  Pt presents with adequate dentition and unremarkable CN exam. Daughter reports pt had tonsillectomy years ago at which time his uvula was cut. Soft palate movement is adequate to prevent nasal regurgitation. Pt tolerated trials of ice chips, puree, thin liquid, and solid textures. No overt s/s aspiration observed immediately following any texture given, however, delayed cough noted after presentations had ended. This raises suspicion for esophageal involvement (Hiatal hernia noted per CXR documentation). Pt reports sensation of dry cracker getting stuck behind the soft palate causing him to cough. Daughter indicates this is a regular occurrence when eating/drinking, and has been the case for decades. Given suspicion for both oropharyngeal and esophageal deficits, recommend proceeding with MBS to objectively assess oropharyngeal swallow and complete esophageal sweep to determine if further esophageal examination is warranted. Scheduled with radiology for  this morning.  SLP Visit Diagnosis: Dysphagia, unspecified (R13.10)    Aspiration Risk  Moderate aspiration risk    Diet Recommendation  Pending MBS  Supervision: Patient able to self feed Compensations: Minimize environmental distractions;Slow rate;Small sips/bites Postural Changes: Seated upright at 90 degrees;Remain upright for at least 30 minutes after po intake    Other  Recommendations Oral Care Recommendations: Oral care BID    Recommendations for follow up therapy are one component of a multi-disciplinary discharge planning process, led by the attending physician.  Recommendations may be updated based on patient status, additional functional criteria and insurance authorization.  Follow up Recommendations Skilled nursing-short term rehab (<3 hours/day)      Assistance Recommended at Discharge Intermittent Supervision/Assistance  Functional Status Assessment Patient has had a recent decline in their functional status and/or demonstrates limited ability to make significant improvements in function in a reasonable and predictable amount of time   Frequency and Duration  Pending MBS          Prognosis Prognosis for Safe Diet Advancement: [place] Study   General Date of Onset: 05/25/21 HPI: 88yo male admitted 05/25/21 with left ankle pain after a fall. Surgery 05/26/21 PMH: DM2, HLD, HTN, macular degeneration, prostate cancer. CXR = increasing atelectasis or developing infiltrate RLL, moderate hiatal hernia Type of Study: Bedside Swallow Evaluation Previous Swallow Assessment: none SLE 07/18/20 22/28 MMSE Diet Prior to this Study: Regular;Thin liquids Temperature Spikes Noted: No Respiratory Status: Room air History of Recent Intubation: No Behavior/Cognition: Alert;Cooperative;Pleasant mood Oral Cavity Assessment: Within Functional Limits Oral Care Completed by SLP: No Oral Cavity - Dentition: Adequate natural dentition Vision: Functional for  self-feeding Self-Feeding Abilities: Able to feed self Patient Positioning: Upright in  bed Baseline Vocal Quality: Normal Volitional Cough: Strong Volitional Swallow: Able to elicit    Oral/Motor/Sensory Function Overall Oral Motor/Sensory Function: Within functional limits   Ice Chips Ice chips: Within functional limits Presentation: Spoon   Thin Liquid Thin Liquid: Within functional limits Presentation: Cup;Straw Other Comments: delayed cough after presentations    Nectar Thick Nectar Thick Liquid: Not tested   Honey Thick Honey Thick Liquid: Not tested   Puree Puree: Within functional limits Presentation: Spoon   Solid     Solid: Within functional limits Presentation: [place] B. Quentin Ore, [facility], [place] Speech Language Pathologist Office: [phone]  Shonna Chock 05/28/2021,10:27 AM

## 2021-05-28 NOTE — TOC Transition Note (Signed)
Transition of Care [facility]) - CM/SW Discharge Note   Patient Details  Name: Albert Rogers MRN: [id number] Date of Birth: [birth year]  [facility] CM/SW Contact:  Lennart Pall, LCSW Phone Number: 05/28/2021, 1:32 PM   Clinical Narrative:    Pt medically cleared for dc to SNF today and bed accepted at [place]. PTAR called at 1:30.  RN to call report to [phone].  No further TOC needs.   Final next level of care: Skilled Nursing Facility Barriers to Discharge: Barriers Resolved   Patient Goals and CMS Choice Patient states their goals for this hospitalization and ongoing recovery are:: to eventually return home following SNF rehab      Discharge Placement   Existing PASRR number confirmed : 05/27/21          Patient chooses bed at: [place], [place] Patient to be transferred to facility by: [facility] [place] Name of family member notified: daughter Patient and family notified of of transfer: 05/28/21  Discharge Plan and Services In-house Referral: Clinical Social Work   Post Acute Care Choice: [place]          DME Arranged: N/A DME Agency: NA                  Social Determinants of Health ([place]) Interventions     Readmission Risk Interventions Readmission Risk Prevention Plan 05/27/2021  Transportation Screening Complete  PCP or Specialist Appt within 5-7 Days Complete  Home Care Screening Complete  Medication Review (RN CM) Complete  Some recent data might be hidden

## 2021-05-28 NOTE — Care Management Important Message (Signed)
Important Message  Patient Details IM Letter placed in Patients room. Name: Albert Rogers MRN: [id number] Date of Birth: March 27, 1933   Medicare Important Message Given:  Yes     Kerin Salen 05/28/2021, 12:08 PM

## 2021-05-28 NOTE — Plan of Care (Signed)
Pt ready to DC to [place] via PTAR.

## 2021-05-28 NOTE — Assessment & Plan Note (Signed)
MBS done today, esophageal stasis noted  SLP recommending esopahgeal assessment Will recommend esophagram and GI follow up outpatient  See SLP recs below Recommended Consults: Consider esophageal assessment   SLP Diet Recommendations: Regular solids;Thin liquid   Liquid Administration via: Straw;Cup   Medication Administration: Whole meds with liquid   Supervision: Patient able to self feed   Compensations: Minimize environmental distractions;Slow rate;Small sips/bites;Multiple dry swallows after each bite/sip   Postural Changes: Seated upright at 90 degrees;Remain semi-upright after after feeds/meals (Comment)   Oral Care Recommendations: Oral care BID

## 2021-05-28 NOTE — Progress Notes (Signed)
Modified Barium Swallow Progress Note  Patient Details  Name: Albert Rogers MRN: [id number] Date of Birth: [birth year]  Today's Date: 05/28/2021  Modified Barium Swallow completed.  Full report located under Chart Review in the Imaging Section.  Brief recommendations include the following:  Clinical Impression Orally, pt presents with adequate oral prep and clearing. Trace sublingual residue noted occasionally, which was either swallowed or spilled to vallecular sinus. Dry swallow eliminated residue.   Pharyngeal swallow was grossly WNL. Thin liquids noted to spill to the pyriform sinus prior to trigger of swallow reflex. Intermittent vallecular residue noted, which cleared with dry swallow. No penetration or aspiration occurred on any texture. Insignificant vallecular residue after the swallow cleared with dry swallow.   Esophageal sweep revealed it to be very slow to clear, with stasis throughout. Occasional backflow to the pharynx. This could be the cause of pt's delayed coughing if material enters the airway due to esophageal backflow.   Pt/daughter were given written behavioral and dietary strategies for the management of esophageal dysmotility. Pt may benefit from further esophageal work up in addition. Recommend continuing with current (regular/thin) diet with adherence to strategies provided today.     Swallow Evaluation Recommendations  Recommended Consults: Consider esophageal assessment   SLP Diet Recommendations: Regular solids;Thin liquid   Liquid Administration via: Straw;Cup   Medication Administration: Whole meds with liquid   Supervision: Patient able to self feed   Compensations: Minimize environmental distractions;Slow rate;Small sips/bites;Multiple dry swallows after each bite/sip   Postural Changes: Seated upright at 90 degrees;Remain semi-upright after after feeds/meals (Comment)   Oral Care Recommendations: Oral care BID      Albert Rogers B. Quentin Ore, [facility],  [place] Speech Language Pathologist Office: [phone]  Albert Rogers 05/28/2021,11:49 AM

## 2021-05-28 NOTE — Discharge Summary (Signed)
Physician Discharge Summary  Albert Rogers KVQ:[id number] DOB: 07/25/1932 DOA: 05/25/2021  PCP: Albert Rogers, L.Marlou Sa, MD  Admit date: 05/25/2021 Discharge date: 05/28/2021  Time spent: 40 minutes  Recommendations for Outpatient Follow-up:  Follow outpatient CBC/CMP Lovenox for DVT ppx per orthopedics, discharged with 28 days (follow up need for continuing course with orthopedics).  Watch platelets with anticoagulation and hx thrombocytopenia Esophageal stasis on MBS, follow esophagram outpatient - follow with GI outpatient Continue speech follow outpatient, follow recs noted below Caution with opiates, low dose norco ordered at discharge  Discharge Diagnoses:  Principal Problem:   Ankle fracture Active Problems:   Aspiration pneumonia ([place])   Esophageal abnormality   Type 2 diabetes mellitus without complication ([place])   Dyslipidemia   Benign essential hypertension   Thrombocytopenia ([place])   Hard of hearing   Macular degeneration   Discharge Condition: stable  Diet recommendation: heart healthy, diabetic  Filed Weights   05/25/21 1026  Weight: 87.5 kg    History of present illness:  Albert Rogers is Albert Rogers 88 y.o. male with medical history significant of DM2, HLD, HTN. Who was admitted for Albert Rogers L trimalleolar fracture with dislocation at tibiotalar joint.  He's now s/p surgery with orthopedics.  Hospitalization c/b fever post op, being treated for aspiration pneumonia.  Plan is for discharge today to SNF.  See below for additional details  Hospital Course:  * Ankle fracture- (present on admission) Trimalleolar fx with dislocation at tibiotalar joint No s/o L distal fibula open reduction internal fixation, left medial malleolus open reduction internal fixation, left open syndesmosis reduction and internal fixation on 1/11 with orthopedic Strict NWB operative extremity, max elevation - NWB 2-3 months, maintain short leg splint until follow up Ortho recommending lovenox for DVT  ppx PT/OT 1 week f/u for wound check Sutures outin 3 weeks with exchange of short leg splint to short leg cast Per ortho  Aspiration pneumonia ([place]) Post op fever, tachycardia CXR with developing infiltrate R base, suspect aspiration pneumonia He's improving, will transition to oral abx Blood cx pending UA 1/10 not c/w UTI Follow outpatient   Esophageal abnormality MBS done today, esophageal stasis noted  SLP recommending esopahgeal assessment Will recommend esophagram and GI follow up outpatient  See SLP recs below Recommended Consults: Consider esophageal assessment    SLP Diet Recommendations: Regular solids;Thin liquid    Liquid Administration via: Straw;Cup    Medication Administration: Whole meds with liquid    Supervision: Patient able to self feed    Compensations: Minimize environmental distractions;Slow rate;Small sips/bites;Multiple dry swallows after each bite/sip    Postural Changes: Seated upright at 90 degrees;Remain semi-upright after after feeds/meals (Comment)    Oral Care Recommendations: Oral care BID  Dyslipidemia- (present on admission) crestor  Type 2 diabetes mellitus without complication (HCC) SSI On metformin at home  Thrombocytopenia ([place]) Trend, noted, some degree of chronicity Would follow closely with plan to d/c on lovenox  Benign essential hypertension- (present on admission) lisinopril  Macular degeneration At risk for delirium  Hard of hearing At risk for delirium    Procedures: MBS  L distal fibula open reduction internal fixation, left medial malleolus open reduction internal fixation, left open syndesmosis reduction and internal fixation on 1/11 with orthopedic  Consultations: orthopedics  Discharge Exam: Vitals:   05/28/21 0508 05/28/21 1211  BP: (!) 162/90 126/71  Pulse: (!) 106 98  Resp: 16 20  Temp: 98.7 F (37.1 C) (!) 97.4 F (36.3 C)  SpO2: 90% 98%  No complaints Discussed discharge plan with  daughter  General: No acute distress. Cardiovascular: RRR Lungs: unlabored Abdomen: Soft, nontender, nondistended  Neurological: Alert and oriented 3. Moves all extremities 4. Cranial nerves II through XII grossly intact. Extremities: LLE with splint in place  Discharge Instructions   Discharge Instructions     Ambulatory referral to Gastroenterology   Complete by: As directed    Call MD for:  difficulty breathing, headache or visual disturbances   Complete by: As directed    Call MD for:  extreme fatigue   Complete by: As directed    Call MD for:  hives   Complete by: As directed    Call MD for:  persistant dizziness or light-headedness   Complete by: As directed    Call MD for:  persistant nausea and vomiting   Complete by: As directed    Call MD for:  redness, tenderness, or signs of infection (pain, swelling, redness, odor or green/yellow discharge around incision site)   Complete by: As directed    Call MD for:  severe uncontrolled pain   Complete by: As directed    Call MD for:  temperature >100.4   Complete by: As directed    Diet - low sodium heart healthy   Complete by: As directed    Discharge instructions   Complete by: As directed    You were seen for Albert Rogers left ankle fracture.  This has been repaired by orthopedics.  You'll need to be nonweightbearing to this leg for 2-3 months.  Follow up with orthopedics regarding this.  We'll send you on DVT prophylaxis (blood clot prevention).  I'll send you with Albert Rogers month of lovenox.  This may need to be extended based on further orthopedic recommendations.  Please follow up with them for further recommendations.  I'll send you with Albert Rogers couple days of antibiotics for aspiration pneumonia.  You were seen by speech therapy and had an abnormal MBS with esophageal stasis.  You'll need Albert Rogers esophagram outpatient as well as gastroenterology follow up.  Follow the recommendations of speech therapy to help reduce the risk of  aspiration.  Return for new, recurrent, or worsening symptoms.  Please ask your PCP to request records from this hospitalization so they know what was done and what the next steps will be.   Discharge wound care:   Complete by: As directed    Per orthopedics   Increase activity slowly   Complete by: As directed    Non weight bearing   Complete by: As directed    Laterality: left   Extremity: Lower      Allergies as of 05/28/2021       Reactions   Codeine Other (See Comments)   Wife states pt hallucinates with codeine Other reaction(s): Hallucinations Other reaction(s): Hallucinations        Medication List     STOP taking these medications    amLODipine 5 MG tablet Commonly known as: NORVASC       TAKE these medications    Accu-Chek Softclix Lancets lancets Use 1 lancet as directed   acetaminophen 325 MG tablet Commonly known as: Tylenol Take 2 tablets (650 mg total) by mouth every 4 (four) hours as needed.   amoxicillin-clavulanate 875-125 MG tablet Commonly known as: AUGMENTIN Take 1 tablet by mouth every 12 (twelve) hours for 3 days.   enoxaparin 40 MG/0.4ML injection Commonly known as: LOVENOX Inject 0.4 mLs (40 mg total) into the skin daily for 28 days. Follow  up with orthopedics to determine if this needs to be continued.   glucose blood test strip Use to check blood sugars   glucose blood test strip Use 1 test strip to check blood sugars   HYDROcodone-acetaminophen 5-325 MG tablet Commonly known as: Norco Take 0.5-1 tablets by mouth every 6 (six) hours as needed for up to 3 days (pain not controlled by tylenol (try 1/2 pill first)).   lisinopril 10 MG tablet Commonly known as: ZESTRIL Take 10 mg by mouth daily.   metFORMIN 500 MG tablet Commonly known as: GLUCOPHAGE Take 500 mg by mouth 2 (two) times daily.   Ocuvite Adult 50+ Caps Take 1 capsule by mouth daily.   rosuvastatin 10 MG tablet Commonly known as: CRESTOR Take 10 mg by  mouth 3 (three) times Rudie Rikard week. Monday Wednesday and Friday   VITAMIN D PO Take 1 capsule by mouth daily.               Discharge Care Instructions  (From admission, onward)           Start     Ordered   05/28/21 0000  Non weight bearing       Question Answer Comment  Laterality left   Extremity Lower      05/28/21 1210   05/28/21 0000  Discharge wound care:       Comments: Per orthopedics   05/28/21 1210           Allergies  Allergen Reactions   Codeine Other (See Comments)    Wife states pt hallucinates with codeine Other reaction(s): Hallucinations Other reaction(s): Hallucinations      The results of significant diagnostics from this hospitalization (including imaging, microbiology, ancillary and laboratory) are listed below for reference.    Significant Diagnostic Studies: DG Tibia/Fibula Left  Result Date: 05/25/2021 CLINICAL DATA:  Trauma, fall EXAM: LEFT TIBIA AND FIBULA - 2 VIEW COMPARISON:  None. FINDINGS: There is fracture in the base of medial malleolus extending to the posterior aspect of distal tibia. There is slightly displaced fracture in the distal metaphyseal region of fibula. There is widening of joint space in the medial and anterior aspects of the ankle suggesting subluxation. Scattered arterial calcifications are seen in the soft tissues. There is soft tissue swelling around the ankle. IMPRESSION: Slightly displaced fractures are seen in the distal left tibia and fibula. Subluxation is noted in the left ankle joint. Electronically Signed   By: Elmer Picker M.D.   On: 05/25/2021 11:02   DG Ankle 2 Views Left  Result Date: 05/26/2021 CLINICAL DATA:  Operative fixation of Alizza Sacra left ankle trimalleolar fracture. EXAM: LEFT ANKLE - 2 VIEW COMPARISON:  05/25/2021 FINDINGS: Six C-arm views of the left ankle demonstrate screw and plate fixation of the previously demonstrated medial and lateral malleolus fractures. Improved position and alignment  of the posterior malleolus fracture on these views. IMPRESSION: Hardware fixation of the previously demonstrated medial and lateral malleolus fractures with improved position and alignment of the previously demonstrated posterior malleolus fracture. Electronically Signed   By: Claudie Revering M.D.   On: 05/26/2021 17:02   DG Ankle 2 Views Left  Result Date: 05/25/2021 CLINICAL DATA:  Ankle fracture.  Status post reduction. EXAM: LEFT ANKLE - 2 VIEW COMPARISON:  Two-view tibia and fibular radiographs 05/25/2021. CT of the ankle 05/25/2021. FINDINGS: The ankle is splinted. Displaced posterior tibial fracture unchanged. Displaced distal fibular fracture is unchanged. Ankle joint remains displaced medially. IMPRESSION: 1. Stable appearance of trimalleolar  ankle fracture. 2. Interval placement of fiberglass splint. Electronically Signed   By: San Morelle M.D.   On: 05/25/2021 12:50   DG Ankle Complete Left  Result Date: 05/25/2021 CLINICAL DATA:  Trauma, pain EXAM: LEFT ANKLE COMPLETE - 3+ VIEW COMPARISON:  None. FINDINGS: There is recent oblique fracture in the distal metaphysis of fibula. There is posterior and lateral displacement of distal major fracture fragment. There is comminuted fracture in the base of medial malleolus extending to the distal metaphyseal region of tibia. Fracture line involves articular surface. There is widening of joint space in the anterior and medial aspect of the ankle mortise. IMPRESSION: Slightly displaced fractures are seen in the distal left tibia and fibula. There is subluxation in the ankle joint. Electronically Signed   By: Elmer Picker M.D.   On: 05/25/2021 11:01   CT Ankle Left Wo Contrast  Result Date: 05/25/2021 CLINICAL DATA:  Ankle trauma, fracture. EXAM: CT OF THE LEFT ANKLE WITHOUT CONTRAST TECHNIQUE: Multidetector CT imaging of the left ankle was performed according to the standard protocol. Multiplanar CT image reconstructions were also generated.  COMPARISON:  Radiographs dated May 25, 2021 FINDINGS: Bones/Joint/Cartilage Posteriorly displaced fracture of the distal fibula with approximately 6 mm posterior displacement of the distal fracture fragment. Mildly displaced fracture of the posterior malleolus with approximately 2 mm posterior displacement. There is mildly displaced fracture of the medial malleolus. There is lateral displacement at the tibiotalar joint. Subtalar joint is intact. The talonavicular joint is maintained. Ligaments Suboptimally assessed by CT. Muscles and Tendons Soft tissue swelling about the ankle. No evidence of tendon entrapment. Soft tissues Prominent subcutaneous soft tissue edema about the ankle. IMPRESSION: Trimalleolar fracture with dislocation at the tibiotalar joint. No evidence of tendon entrapment or intra-articular osseous fragment. Electronically Signed   By: Keane Police D.O.   On: 05/25/2021 13:42   DG CHEST PORT 1 VIEW  Result Date: 05/26/2021 CLINICAL DATA:  Fever EXAM: PORTABLE CHEST 1 VIEW COMPARISON:  07/16/2020, CT 12/29/2010 FINDINGS: Mild diffuse reticular interstitial opacity. Moderate hiatal hernia. Increased airspace opacity at right base. No pleural effusion or pneumothorax. Aortic atherosclerosis IMPRESSION: Increasing atelectasis or developing infiltrate right base. Moderate hiatal hernia Electronically Signed   By: Donavan Foil M.D.   On: 05/26/2021 20:50   DG Knee Complete 4 Views Right  Result Date: 05/25/2021 CLINICAL DATA:  Trauma, fall EXAM: RIGHT KNEE - COMPLETE 4+ VIEW COMPARISON:  None. FINDINGS: No recent fracture or dislocation is seen. There is no significant effusion in the suprapatellar bursa. Bony spurs seen in patella. There is minimal undulation in the articular surface of patella without definite break in the cortical margins. This may be normal variation or residual change from previous trauma. There is joint space narrowing in the medial compartment. Arterial calcifications  are seen in the soft tissues. IMPRESSION: No recent displaced fracture or dislocation is seen in the right knee. There is no significant effusion. Degenerative changes are noted with bony spurs in the the patellofemoral compartment. Electronically Signed   By: Elmer Picker M.D.   On: 05/25/2021 11:04   DG Swallowing Func-Speech Pathology  Result Date: 05/28/2021 Table formatting from the original result was not included. Objective Swallowing Evaluation: Type of Study: MBS-Modified Barium Swallow Study  Patient Details Name: Trevino Wyatt MRN: [id number] Date of Birth: 02-May-1933 Today's Date: 05/28/2021 Time: SLP Start Time (ACUTE ONLY): 1100 -SLP Stop Time (ACUTE ONLY): 1130 SLP Time Calculation (min) (ACUTE ONLY): 30 min Past Medical History: Past Medical  History: Diagnosis Date  Diabetes ([place])   Macular degeneration   Prostate cancer [facility])  Past Surgical History: Past Surgical History: Procedure Laterality Date  ORIF ANKLE FRACTURE Left 05/26/2021  Procedure: OPEN REDUCTION INTERNAL FIXATION (ORIF) ANKLE FRACTURE;  Surgeon: Armond Hang, MD;  Location: [facility]S;  Service: Orthopedics;  Laterality: Left; HPI: 88yo male admitted 05/25/21 with left ankle pain after Yvetta Drotar fall. Surgery 05/26/21 PMH: DM2, HLD, HTN, macular degeneration, prostate cancer. CXR = increasing atelectasis or developing infiltrate RLL, moderate hiatal hernia  Subjective: Pt seen in radiology for MBS. Daughter present  Recommendations for follow up therapy are one component of Dailah Opperman multi-disciplinary discharge planning process, led by the attending physician.  Recommendations may be updated based on patient status, additional functional criteria and insurance authorization. Assessment / Plan / Recommendation Clinical Impressions 05/28/2021 Clinical Impression Orally, pt presents with adequate oral prep and clearing. Trace sublingual residue noted occasionally, which was either swallowed or spilled to vallecular sinus. Dry swallow eliminated  residue. Pharyngeal swallow was grossly WNL. Thin liquids noted to spill to the pyriform sinus prior to trigger of swallow reflex. Intermittent vallecular residue noted, which cleared with dry swallow. No penetration or aspiration occurred on any texture. Insignificant vallecular residue after the swallow cleared with dry swallow. Esophageal sweep revealed it to be very slow to clear, with stasis throughout. Occasional backflow to the pharynx. This could be the cause of pt's delayed coughing if material enters the airway due to esophageal backflow. Pt/daughter were given written behavioral and dietary strategies for the management of esophageal dysmotility. Pt may benefit from further esophageal work up in addition. Recommend continuing with current (regular/thin) diet with adherence to strategies provided today.  SLP Visit Diagnosis Dysphagia, pharyngoesophageal phase (R13.14)     Impact on safety and function Moderate aspiration risk   Treatment Recommendations 05/28/2021 Treatment Recommendations None    Prognosis 05/28/2021 Prognosis for Safe Diet Advancement Good     Diet Recommendations 05/28/2021 SLP Diet Recommendations Regular solids Thin liquid  Liquid Administration via Straw Cup  Medication Administration Whole meds with liquid  Compensations Minimize environmental distractions Slow rate Small sips/bites Multiple dry swallows after each bite/sip  Postural Changes Seated upright at 90 degrees Remain semi-upright after after feeds/meals    Other Recommendations 05/28/2021 Recommended Consults Consider esophageal assessment  Oral Care Recommendations Oral care BID   Follow Up Recommendations Skilled nursing-short term rehab (<3 hours/day)  Assistance recommended at discharge Intermittent Supervision/Assistance  Functional Status Assessment Patient has had Hershell Brandl recent decline in their functional status and/or demonstrates limited ability to make significant improvements in function in Krystall Kruckenberg reasonable and predictable  amount of time   Oral Phase 05/28/2021 Oral Phase [facility] - trace sublingual residue cleared with dry swallow   Pharyngeal Phase 05/28/2021 Pharyngeal Phase [facility] - No penetration or aspiration, minimal vallecular residue cleared with dry swallow   Cervical Esophageal Phase  05/28/2021 Cervical Esophageal Phase [facility]  Celia B. Quentin [facility], [place] Speech Language Pathologist Office: [phone] Shonna Chock 05/28/2021, 11:45 AM                     DG C-Arm 1-60 Min-No Report  Result Date: 05/26/2021 Fluoroscopy was utilized by the requesting physician.  No radiographic interpretation.   DG C-Arm 1-60 Min-No Report  Result Date: 05/26/2021 Fluoroscopy was utilized by the requesting physician.  No radiographic interpretation.   DG C-Arm 1-60 Min-No Report  Result Date: 05/26/2021 Fluoroscopy was utilized by the requesting physician.  No radiographic  interpretation.    Microbiology: Recent Results (from the past 240 hour(s))  Resp Panel by RT-PCR (Flu Daijanae Rafalski&B, Covid) Nasopharyngeal Swab     Status: None   Collection Time: 05/25/21 12:22 PM   Specimen: Nasopharyngeal Swab; Nasopharyngeal(NP) swabs in vial transport medium  Result Value Ref Range Status   SARS Coronavirus 2 by RT PCR NEGATIVE NEGATIVE Final    Comment: (NOTE) SARS-CoV-2 target nucleic acids are NOT DETECTED.  The SARS-CoV-2 RNA is generally detectable in upper respiratory specimens during the acute phase of infection. The lowest concentration of SARS-CoV-2 viral copies this assay can detect is 138 copies/mL. Navy Belay negative result does not preclude SARS-Cov-2 infection and should not be used as the sole basis for treatment or other patient management decisions. Brooks Stotz negative result may occur with  improper specimen collection/handling, submission of specimen other than nasopharyngeal swab, presence of viral mutation(s) within the areas targeted by this assay, and inadequate number of viral copies(<138 copies/mL). Angelee Bahr negative result must  be combined with clinical observations, patient history, and epidemiological information. The expected result is Negative.  Fact Sheet for Patients:  EntrepreneurPulse.com.au  Fact Sheet for Healthcare Providers:  IncredibleEmployment.be  This test is no t yet approved or cleared by the [place] FDA and  has been authorized for detection and/or diagnosis of SARS-CoV-2 by FDA under an Emergency Use Authorization (EUA). This EUA will remain  in effect (meaning this test can be used) for the duration of the COVID-19 declaration under Section 564(b)(1) of the Act, 21 U.S.C.section 360bbb-3(b)(1), unless the authorization is terminated  or revoked sooner.       Influenza Isamu Trammel by PCR NEGATIVE NEGATIVE Final   Influenza B by PCR NEGATIVE NEGATIVE Final    Comment: (NOTE) The Xpert Xpress SARS-CoV-2/FLU/RSV plus assay is intended as an aid in the diagnosis of influenza from Nasopharyngeal swab specimens and should not be used as Zabria Liss sole basis for treatment. Nasal washings and aspirates are unacceptable for Xpert Xpress SARS-CoV-2/FLU/RSV testing.  Fact Sheet for Patients: EntrepreneurPulse.com.au  Fact Sheet for Healthcare Providers: IncredibleEmployment.be  This test is not yet approved or cleared by the [place] FDA and has been authorized for detection and/or diagnosis of SARS-CoV-2 by FDA under an Emergency Use Authorization (EUA). This EUA will remain in effect (meaning this test can be used) for the duration of the COVID-19 declaration under Section 564(b)(1) of the Act, 21 U.S.C. section 360bbb-3(b)(1), unless the authorization is terminated or revoked.  Performed at [facility], [place] [address] [place], [place] [address]   Surgical PCR screen     Status: None   Collection Time: 05/26/21  6:25 AM   Specimen: Nasal Mucosa; Nasal Swab  Result Value Ref Range Status   MRSA,  PCR NEGATIVE NEGATIVE Final   Staphylococcus aureus NEGATIVE NEGATIVE Final    Comment: (NOTE) The Xpert SA Assay (FDA approved for NASAL specimens in patients 50 years of age and older), is one component of Shuayb Schepers comprehensive surveillance program. It is not intended to diagnose infection nor to guide or monitor treatment. Performed at [facility], [place] [address], [place], [place] [address]   Culture, blood (routine x 2)     Status: None (Preliminary result)   Collection Time: 05/26/21  7:58 PM   Specimen: BLOOD  Result Value Ref Range Status   Specimen Description   Final    BLOOD LEFT ANTECUBITAL Performed at [facility] [address], [place], [place] [address]  Special Requests   Final    BOTTLES DRAWN AEROBIC ONLY Blood Culture results may not be optimal due to an inadequate volume of blood received in culture bottles Performed at [place] [address] [place], [place] [address]    Culture   Final    NO GROWTH 2 DAYS Performed at [place] [address], [place], [place] [address]    Report Status PENDING  Incomplete  Culture, blood (routine x 2)     Status: None (Preliminary result)   Collection Time: 05/26/21  7:58 PM   Specimen: BLOOD  Result Value Ref Range Status   Specimen Description   Final    BLOOD RIGHT ANTECUBITAL Performed at [facility] [address] [place], [place] [address]    Special Requests   Final    BOTTLES DRAWN AEROBIC ONLY Blood Culture adequate volume Performed at [facility] [address] [place], [place] [address]    Culture   Final    NO GROWTH 2 DAYS Performed at [facility] [address], [place],  [address]    Report Status PENDING  Incomplete  Resp Panel by RT-PCR (Flu Darell Saputo&B, Covid) Nasopharyngeal Swab     Status: None   Collection Time: 05/28/21 10:17 AM   Specimen: Nasopharyngeal Swab;  Nasopharyngeal(NP) swabs in vial transport medium  Result Value Ref Range Status   SARS Coronavirus 2 by RT PCR NEGATIVE NEGATIVE Final    Comment: (NOTE) SARS-CoV-2 target nucleic acids are NOT DETECTED.  The SARS-CoV-2 RNA is generally detectable in upper respiratory specimens during the acute phase of infection. The lowest concentration of SARS-CoV-2 viral copies this assay can detect is 138 copies/mL. Manika Hast negative result does not preclude SARS-Cov-2 infection and should not be used as the sole basis for treatment or other patient management decisions. Nadie Fiumara negative result may occur with  improper specimen collection/handling, submission of specimen other than nasopharyngeal swab, presence of viral mutation(s) within the areas targeted by this assay, and inadequate number of viral copies(<138 copies/mL). Meilani Edmundson negative result must be combined with clinical observations, patient history, and epidemiological information. The expected result is Negative.  Fact Sheet for Patients:  EntrepreneurPulse.com.au  Fact Sheet for Healthcare Providers:  IncredibleEmployment.be  This test is no t yet approved or cleared by the [place] FDA and  has been authorized for detection and/or diagnosis of SARS-CoV-2 by FDA under an Emergency Use Authorization (EUA). This EUA will remain  in effect (meaning this test can be used) for the duration of the COVID-19 declaration under Section 564(b)(1) of the Act, 21 U.S.C.section 360bbb-3(b)(1), unless the authorization is terminated  or revoked sooner.       Influenza Jadiel Schmieder by PCR NEGATIVE NEGATIVE Final   Influenza B by PCR NEGATIVE NEGATIVE Final    Comment: (NOTE) The Xpert Xpress SARS-CoV-2/FLU/RSV plus assay is intended as an aid in the diagnosis of influenza from Nasopharyngeal swab specimens and should not be used as Quinlee Sciarra sole basis for treatment. Nasal washings and aspirates are unacceptable for Xpert Xpress  SARS-CoV-2/FLU/RSV testing.  Fact Sheet for Patients: EntrepreneurPulse.com.au  Fact Sheet for Healthcare Providers: IncredibleEmployment.be  This test is not yet approved or cleared by the [place] FDA and has been authorized for detection and/or diagnosis of SARS-CoV-2 by FDA under an Emergency Use Authorization (EUA). This EUA will remain in effect (meaning this test can be used) for the duration of the COVID-19 declaration under Section 564(b)(1)  of the Act, 21 U.S.C. section 360bbb-3(b)(1), unless the authorization is terminated or revoked.  Performed at [facility], [place] [address], [place], [place] [address]      Labs: Basic Metabolic Panel: Recent Labs  Lab 05/25/21 1222 05/26/21 0251 05/26/21 1958 05/27/21 0307 05/28/21 0300  NA 136 133* 131* 134* 132*  K 3.5 4.1 4.2 3.9 4.0  CL 102 100 100 101 98  CO2 27 23 22 24 25   GLUCOSE 143* 163* 181* 145* 134*  BUN 15 15 15 17 19   CREATININE 0.67 0.75 0.88 0.79 0.68  CALCIUM 8.6* 8.5* 8.0* 7.8* 7.9*  MG  --   --   --  2.0 2.2  PHOS  --   --   --  4.2 2.5   Liver Function Tests: Recent Labs  Lab 05/26/21 0251 05/27/21 0307  AST 25 20  ALT 18 14  ALKPHOS 62 47  BILITOT 1.1 0.5  PROT 6.3* 5.8*  ALBUMIN 3.4* 3.0*   No results for input(s): LIPASE, AMYLASE in the last 168 hours. No results for input(s): AMMONIA in the last 168 hours. CBC: Recent Labs  Lab 05/25/21 1222 05/26/21 0251 05/26/21 1958 05/27/21 0307 05/28/21 0300  WBC 6.9 5.0 7.7 4.9 5.3  NEUTROABS 5.5  --  6.6 3.6  --   HGB 12.2* 11.2* 10.4* 9.9* 9.7*  HCT 38.0* 34.7* 32.1* 30.9* 30.0*  MCV 89.8 88.7 88.9 90.4 90.6  PLT 131* 105* 109* 95* 105*   Cardiac Enzymes: No results for input(s): CKTOTAL, CKMB, CKMBINDEX, TROPONINI in the last 168 hours. BNP: BNP (last 3 results) No results for input(s): BNP in the last 8760 hours.  ProBNP (last 3 results) No results for input(s):  PROBNP in the last 8760 hours.  CBG: Recent Labs  Lab 05/27/21 1120 05/27/21 1606 05/27/21 2116 05/28/21 0741 05/28/21 1145  GLUCAP 145* 120* 150* 126* 194*       Signed:  Fayrene Helper MD.  [facility] 05/28/2021, 12:21 PM

## 2021-05-30 DIAGNOSIS — E1142 Type 2 diabetes mellitus with diabetic polyneuropathy: Secondary | ICD-10-CM | POA: Diagnosis not present

## 2021-05-30 DIAGNOSIS — F0390 Unspecified dementia without behavioral disturbance: Secondary | ICD-10-CM | POA: Diagnosis not present

## 2021-05-30 DIAGNOSIS — S82852A Displaced trimalleolar fracture of left lower leg, initial encounter for closed fracture: Secondary | ICD-10-CM | POA: Diagnosis not present

## 2021-05-30 DIAGNOSIS — C61 Malignant neoplasm of prostate: Secondary | ICD-10-CM | POA: Diagnosis not present

## 2021-05-31 ENCOUNTER — Other Ambulatory Visit: Payer: Self-pay | Admitting: *Deleted

## 2021-05-31 LAB — CULTURE, BLOOD (ROUTINE X 2)
Culture: NO GROWTH
Culture: NO GROWTH
Special Requests: ADEQUATE

## 2021-05-31 NOTE — Patient Outreach (Signed)
Per [facility] eligible member resides in [facility].  Screened for potential [facility] Care Management services as a benefit of member's insurance plan.  Mr. Hinnant  admitted to [facility] on 05/28/21 after hospitalization s/p ankle surgery.  Will collaborate with facility SW and follow up with resident as appropriate.  Will continue to follow for potential [facility] needs and transition plans while member resides in SNF.     Marthenia Rolling, MSN, RN,BSN [place] Acute Care Coordinator [phone] [facility]) [phone]  (Toll free office)

## 2021-06-01 DIAGNOSIS — H353290 Exudative age-related macular degeneration, unspecified eye, stage unspecified: Secondary | ICD-10-CM | POA: Diagnosis not present

## 2021-06-01 DIAGNOSIS — E119 Type 2 diabetes mellitus without complications: Secondary | ICD-10-CM | POA: Diagnosis not present

## 2021-06-01 DIAGNOSIS — H9190 Unspecified hearing loss, unspecified ear: Secondary | ICD-10-CM | POA: Diagnosis not present

## 2021-06-01 DIAGNOSIS — E785 Hyperlipidemia, unspecified: Secondary | ICD-10-CM | POA: Diagnosis not present

## 2021-06-01 DIAGNOSIS — R54 Age-related physical debility: Secondary | ICD-10-CM | POA: Diagnosis not present

## 2021-06-01 DIAGNOSIS — S82852D Displaced trimalleolar fracture of left lower leg, subsequent encounter for closed fracture with routine healing: Secondary | ICD-10-CM | POA: Diagnosis not present

## 2021-06-08 DIAGNOSIS — H353221 Exudative age-related macular degeneration, left eye, with active choroidal neovascularization: Secondary | ICD-10-CM | POA: Diagnosis not present

## 2021-06-11 DIAGNOSIS — Z4789 Encounter for other orthopedic aftercare: Secondary | ICD-10-CM | POA: Diagnosis not present

## 2021-06-11 DIAGNOSIS — Z9889 Other specified postprocedural states: Secondary | ICD-10-CM | POA: Diagnosis not present

## 2021-06-15 DIAGNOSIS — H353211 Exudative age-related macular degeneration, right eye, with active choroidal neovascularization: Secondary | ICD-10-CM | POA: Diagnosis not present

## 2021-06-16 DIAGNOSIS — W19XXXD Unspecified fall, subsequent encounter: Secondary | ICD-10-CM | POA: Diagnosis not present

## 2021-06-16 DIAGNOSIS — D696 Thrombocytopenia, unspecified: Secondary | ICD-10-CM | POA: Diagnosis not present

## 2021-06-16 DIAGNOSIS — M25572 Pain in left ankle and joints of left foot: Secondary | ICD-10-CM | POA: Diagnosis not present

## 2021-06-16 DIAGNOSIS — E119 Type 2 diabetes mellitus without complications: Secondary | ICD-10-CM | POA: Diagnosis not present

## 2021-06-16 DIAGNOSIS — S82852D Displaced trimalleolar fracture of left lower leg, subsequent encounter for closed fracture with routine healing: Secondary | ICD-10-CM | POA: Diagnosis not present

## 2021-06-16 DIAGNOSIS — E785 Hyperlipidemia, unspecified: Secondary | ICD-10-CM | POA: Diagnosis not present

## 2021-06-16 DIAGNOSIS — H3530 Unspecified macular degeneration: Secondary | ICD-10-CM | POA: Diagnosis not present

## 2021-06-16 DIAGNOSIS — J690 Pneumonitis due to inhalation of food and vomit: Secondary | ICD-10-CM | POA: Diagnosis not present

## 2021-06-16 DIAGNOSIS — H9190 Unspecified hearing loss, unspecified ear: Secondary | ICD-10-CM | POA: Diagnosis not present

## 2021-06-16 DIAGNOSIS — I10 Essential (primary) hypertension: Secondary | ICD-10-CM | POA: Diagnosis not present

## 2021-06-18 DIAGNOSIS — Z4789 Encounter for other orthopedic aftercare: Secondary | ICD-10-CM | POA: Diagnosis not present

## 2021-06-21 ENCOUNTER — Other Ambulatory Visit: Payer: Self-pay | Admitting: *Deleted

## 2021-06-21 NOTE — Patient Outreach (Signed)
[facility] Post- Acute Care Coordinator follow up. Per [place] eligible member resides in [facility].  Screened for potential [facility] Care Management services as a benefit of [name]'s insurance plan.  Update received from  [place], [place] indicating Albert Rogers has transitioned to private pay until his weightbearing status changes. SNF will obtain reauthorization for stay when appropriate.    Will continue to follow along for potential [facility] needs and transition plans.   Will continue to collaborate with SNF SW and follow up with member as appropriate.     Marthenia Rolling, MSN, RN,BSN [place] Acute Care Coordinator [phone] [facility]) [phone]  (Toll free office)

## 2021-06-22 ENCOUNTER — Encounter: Payer: Self-pay | Admitting: Gastroenterology

## 2021-06-22 ENCOUNTER — Ambulatory Visit (INDEPENDENT_AMBULATORY_CARE_PROVIDER_SITE_OTHER): Payer: Medicare Other | Admitting: Gastroenterology

## 2021-06-22 VITALS — BP 124/64 | HR 64

## 2021-06-22 DIAGNOSIS — R059 Cough, unspecified: Secondary | ICD-10-CM | POA: Diagnosis not present

## 2021-06-22 DIAGNOSIS — F0390 Unspecified dementia without behavioral disturbance: Secondary | ICD-10-CM | POA: Diagnosis not present

## 2021-06-22 DIAGNOSIS — Z4789 Encounter for other orthopedic aftercare: Secondary | ICD-10-CM | POA: Diagnosis not present

## 2021-06-22 DIAGNOSIS — R41841 Cognitive communication deficit: Secondary | ICD-10-CM | POA: Diagnosis not present

## 2021-06-22 DIAGNOSIS — M25572 Pain in left ankle and joints of left foot: Secondary | ICD-10-CM | POA: Diagnosis not present

## 2021-06-22 DIAGNOSIS — R1310 Dysphagia, unspecified: Secondary | ICD-10-CM | POA: Insufficient documentation

## 2021-06-22 DIAGNOSIS — S82852D Displaced trimalleolar fracture of left lower leg, subsequent encounter for closed fracture with routine healing: Secondary | ICD-10-CM | POA: Diagnosis not present

## 2021-06-22 DIAGNOSIS — R2681 Unsteadiness on feet: Secondary | ICD-10-CM | POA: Diagnosis not present

## 2021-06-22 DIAGNOSIS — R278 Other lack of coordination: Secondary | ICD-10-CM | POA: Diagnosis not present

## 2021-06-22 DIAGNOSIS — J690 Pneumonitis due to inhalation of food and vomit: Secondary | ICD-10-CM | POA: Diagnosis not present

## 2021-06-22 DIAGNOSIS — R279 Unspecified lack of coordination: Secondary | ICD-10-CM | POA: Diagnosis not present

## 2021-06-22 DIAGNOSIS — M6281 Muscle weakness (generalized): Secondary | ICD-10-CM | POA: Diagnosis not present

## 2021-06-22 DIAGNOSIS — W19XXXD Unspecified fall, subsequent encounter: Secondary | ICD-10-CM | POA: Diagnosis not present

## 2021-06-22 NOTE — Progress Notes (Signed)
06/22/2021 Albert Rogers [id number] 10/23/1932   HISTORY OF PRESENT ILLNESS: This is an 88 year old male who is new to our office.  He was actually referred here from the hospitalist, Dr. Florene Glen, for evaluation regarding dysphagia.  He recently fell and required ORIF of an ankle fracture on the left.  He is now in rehab/nursing facility for that.  He says that while he was in the hospital he would have some coughing and sputtering when eating and drinking and so they decided to evaluate that.  He underwent a modified barium swallow study that suggested some esophageal stasis.  He is here today with his wife.  He tells me that he has had issues intermittently with swallowing solids and liquids and this coughing for years, probably dating back to his 73s.  He says he believes that some of it has to do with not having a uvula that was removed during his tonsillectomy when he was young, etc.  He says that he has too many other issues going on right now that he is not really concerned with this issue at this time.   Past Medical History:  Diagnosis Date   Diabetes ([place])    Macular degeneration    Prostate cancer [facility])    Past Surgical History:  Procedure Laterality Date   ORIF ANKLE FRACTURE Left 05/26/2021   Procedure: OPEN REDUCTION INTERNAL FIXATION (ORIF) ANKLE FRACTURE;  Surgeon: Armond Hang, MD;  Location: [facility] ORS;  Service: Orthopedics;  Laterality: Left;    reports that he has quit smoking. He has never used smokeless tobacco. He reports that he does not currently use alcohol. He reports that he does not use drugs. family history is not on file. Allergies  Allergen Reactions   Codeine Other (See Comments)    Wife states pt hallucinates with codeine Other reaction(s): Hallucinations Other reaction(s): Hallucinations      Outpatient Encounter Medications as of 06/22/2021  Medication Sig   Accu-Chek Softclix Lancets lancets Use 1 lancet as directed   acetaminophen (TYLENOL)  325 MG tablet Take 2 tablets (650 mg total) by mouth every 4 (four) hours as needed.   enoxaparin (LOVENOX) 40 MG/0.4ML injection Inject 0.4 mLs (40 mg total) into the skin daily for 28 days. Follow up with orthopedics to determine if this needs to be continued.   glucose blood test strip Use to check blood sugars   glucose blood test strip Use 1 test strip to check blood sugars   HYDROcodone-acetaminophen (NORCO/VICODIN) 5-325 MG tablet Take 1 tablet by mouth every 6 (six) hours as needed for moderate pain.   insulin aspart (NOVOLOG FLEXPEN) 100 UNIT/ML FlexPen Inject into the skin 3 (three) times daily with meals.   lisinopril (ZESTRIL) 10 MG tablet Take 10 mg by mouth daily.   metFORMIN (GLUCOPHAGE) 500 MG tablet Take 500 mg by mouth 2 (two) times daily.   Multiple Vitamins-Minerals (OCUVITE ADULT 50+) CAPS Take 1 capsule by mouth daily.   rosuvastatin (CRESTOR) 10 MG tablet Take 10 mg by mouth 3 (three) times a week. Monday Wednesday and Friday   sertraline (ZOLOFT) 25 MG tablet Take 25 mg by mouth daily.   VITAMIN D PO Take 1 capsule by mouth daily.   No facility-administered encounter medications on file as of 06/22/2021.     REVIEW OF SYSTEMS  : All other systems reviewed and negative except where noted in the History of Present Illness.   PHYSICAL EXAM: BP 124/64    Pulse 64  General:  Frail white male in no acute distress; in wheelchair Head: Normocephalic and atraumatic Eyes:  Sclerae anicteric, conjunctiva pink. Ears: Normal auditory acuity Lungs: Clear throughout to auscultation; no W/R/R. Heart: Regular rate and rhythm; no M/R/G. Abdomen: Soft, non-distended.  BS present.  Non-tender. Musculoskeletal: Symmetrical with no gross deformities  Skin: No lesions on visible extremities Extremities: No edema.  LLE in a cast/splint. Neurological: Alert oriented x 4, grossly non-focal Psychological:  Alert and cooperative. Normal mood and affect  ASSESSMENT AND PLAN: *88 year old  male sent here for evaluation of dysphagia.  He reports intermittent coughing when drinking and issues with swallowing food and liquids for years, he thinks dating back to when he was in his 35s.  Modified barium swallow study recently suggested esophageal stasis.  We discussed starting with an esophagram for evaluation to rule out stricture versus motility.  He says that he would even prefer to hold off on that for now since he is dealing with so many other things.  Currently in rehab/nursing facility after having surgery on his leg after a fall.  Has skin cancer on his scalp that needs removed, etc.  If he decides to proceed then he will call our office back or could even discuss with his PCP ordering the esophagram.   CC:  Elodia Florence.,*

## 2021-06-22 NOTE — Patient Instructions (Signed)
Consider esophagram when willing and able.  Follow up as needed.   If you are age 86 or older, your body mass index should be between 23-30. Your There is no height or weight on file to calculate BMI. If this is out of the aforementioned range listed, please consider follow up with your Primary Care Provider.  If you are age 59 or younger, your body mass index should be between 19-25. Your There is no height or weight on file to calculate BMI. If this is out of the aformentioned range listed, please consider follow up with your Primary Care Provider.   ________________________________________________________  [facility] providers would like to encourage you to use [facility] to communicate with providers for non-urgent requests or questions.  Due to long hold times on the telephone, sending your provider a message by [facility] may be a faster and more efficient way to get a response.  Please allow 48 business hours for a response.  Please remember that this is for non-urgent requests.  _______________________________________________________

## 2021-06-23 DIAGNOSIS — C4442 Squamous cell carcinoma of skin of scalp and neck: Secondary | ICD-10-CM | POA: Diagnosis not present

## 2021-06-23 DIAGNOSIS — Z85828 Personal history of other malignant neoplasm of skin: Secondary | ICD-10-CM | POA: Diagnosis not present

## 2021-06-25 DIAGNOSIS — W19XXXD Unspecified fall, subsequent encounter: Secondary | ICD-10-CM | POA: Diagnosis not present

## 2021-06-25 DIAGNOSIS — M25572 Pain in left ankle and joints of left foot: Secondary | ICD-10-CM | POA: Diagnosis not present

## 2021-06-25 DIAGNOSIS — S82852D Displaced trimalleolar fracture of left lower leg, subsequent encounter for closed fracture with routine healing: Secondary | ICD-10-CM | POA: Diagnosis not present

## 2021-06-25 DIAGNOSIS — J690 Pneumonitis due to inhalation of food and vomit: Secondary | ICD-10-CM | POA: Diagnosis not present

## 2021-06-25 DIAGNOSIS — Z4789 Encounter for other orthopedic aftercare: Secondary | ICD-10-CM | POA: Diagnosis not present

## 2021-06-25 DIAGNOSIS — R278 Other lack of coordination: Secondary | ICD-10-CM | POA: Diagnosis not present

## 2021-06-29 DIAGNOSIS — R278 Other lack of coordination: Secondary | ICD-10-CM | POA: Diagnosis not present

## 2021-06-29 DIAGNOSIS — W19XXXD Unspecified fall, subsequent encounter: Secondary | ICD-10-CM | POA: Diagnosis not present

## 2021-06-29 DIAGNOSIS — Z4789 Encounter for other orthopedic aftercare: Secondary | ICD-10-CM | POA: Diagnosis not present

## 2021-06-29 DIAGNOSIS — J690 Pneumonitis due to inhalation of food and vomit: Secondary | ICD-10-CM | POA: Diagnosis not present

## 2021-06-29 DIAGNOSIS — S82852D Displaced trimalleolar fracture of left lower leg, subsequent encounter for closed fracture with routine healing: Secondary | ICD-10-CM | POA: Diagnosis not present

## 2021-06-29 DIAGNOSIS — R443 Hallucinations, unspecified: Secondary | ICD-10-CM | POA: Diagnosis not present

## 2021-06-29 DIAGNOSIS — R410 Disorientation, unspecified: Secondary | ICD-10-CM | POA: Diagnosis not present

## 2021-06-29 DIAGNOSIS — Z79899 Other long term (current) drug therapy: Secondary | ICD-10-CM | POA: Diagnosis not present

## 2021-06-29 DIAGNOSIS — M25572 Pain in left ankle and joints of left foot: Secondary | ICD-10-CM | POA: Diagnosis not present

## 2021-06-30 DIAGNOSIS — F4323 Adjustment disorder with mixed anxiety and depressed mood: Secondary | ICD-10-CM | POA: Diagnosis not present

## 2021-06-30 DIAGNOSIS — F29 Unspecified psychosis not due to a substance or known physiological condition: Secondary | ICD-10-CM | POA: Diagnosis not present

## 2021-06-30 DIAGNOSIS — F03918 Unspecified dementia, unspecified severity, with other behavioral disturbance: Secondary | ICD-10-CM | POA: Diagnosis not present

## 2021-07-01 DIAGNOSIS — F0392 Unspecified dementia, unspecified severity, with psychotic disturbance: Secondary | ICD-10-CM | POA: Diagnosis not present

## 2021-07-01 DIAGNOSIS — W19XXXD Unspecified fall, subsequent encounter: Secondary | ICD-10-CM | POA: Diagnosis not present

## 2021-07-01 DIAGNOSIS — S82852D Displaced trimalleolar fracture of left lower leg, subsequent encounter for closed fracture with routine healing: Secondary | ICD-10-CM | POA: Diagnosis not present

## 2021-07-01 DIAGNOSIS — Z4789 Encounter for other orthopedic aftercare: Secondary | ICD-10-CM | POA: Diagnosis not present

## 2021-07-01 DIAGNOSIS — R278 Other lack of coordination: Secondary | ICD-10-CM | POA: Diagnosis not present

## 2021-07-01 DIAGNOSIS — J690 Pneumonitis due to inhalation of food and vomit: Secondary | ICD-10-CM | POA: Diagnosis not present

## 2021-07-01 DIAGNOSIS — M25572 Pain in left ankle and joints of left foot: Secondary | ICD-10-CM | POA: Diagnosis not present

## 2021-07-05 DIAGNOSIS — J690 Pneumonitis due to inhalation of food and vomit: Secondary | ICD-10-CM | POA: Diagnosis not present

## 2021-07-05 DIAGNOSIS — I10 Essential (primary) hypertension: Secondary | ICD-10-CM | POA: Diagnosis not present

## 2021-07-05 DIAGNOSIS — E039 Hypothyroidism, unspecified: Secondary | ICD-10-CM | POA: Diagnosis not present

## 2021-07-05 DIAGNOSIS — S82852D Displaced trimalleolar fracture of left lower leg, subsequent encounter for closed fracture with routine healing: Secondary | ICD-10-CM | POA: Diagnosis not present

## 2021-07-05 DIAGNOSIS — M25572 Pain in left ankle and joints of left foot: Secondary | ICD-10-CM | POA: Diagnosis not present

## 2021-07-05 DIAGNOSIS — R278 Other lack of coordination: Secondary | ICD-10-CM | POA: Diagnosis not present

## 2021-07-05 DIAGNOSIS — W19XXXD Unspecified fall, subsequent encounter: Secondary | ICD-10-CM | POA: Diagnosis not present

## 2021-07-05 DIAGNOSIS — E119 Type 2 diabetes mellitus without complications: Secondary | ICD-10-CM | POA: Diagnosis not present

## 2021-07-05 DIAGNOSIS — R4182 Altered mental status, unspecified: Secondary | ICD-10-CM | POA: Diagnosis not present

## 2021-07-05 DIAGNOSIS — Z4789 Encounter for other orthopedic aftercare: Secondary | ICD-10-CM | POA: Diagnosis not present

## 2021-07-05 DIAGNOSIS — C61 Malignant neoplasm of prostate: Secondary | ICD-10-CM | POA: Diagnosis not present

## 2021-07-06 DIAGNOSIS — Z4789 Encounter for other orthopedic aftercare: Secondary | ICD-10-CM | POA: Diagnosis not present

## 2021-07-06 DIAGNOSIS — F0392 Unspecified dementia, unspecified severity, with psychotic disturbance: Secondary | ICD-10-CM | POA: Diagnosis not present

## 2021-07-06 DIAGNOSIS — J690 Pneumonitis due to inhalation of food and vomit: Secondary | ICD-10-CM | POA: Diagnosis not present

## 2021-07-06 DIAGNOSIS — W19XXXD Unspecified fall, subsequent encounter: Secondary | ICD-10-CM | POA: Diagnosis not present

## 2021-07-06 DIAGNOSIS — S82852D Displaced trimalleolar fracture of left lower leg, subsequent encounter for closed fracture with routine healing: Secondary | ICD-10-CM | POA: Diagnosis not present

## 2021-07-06 DIAGNOSIS — M25572 Pain in left ankle and joints of left foot: Secondary | ICD-10-CM | POA: Diagnosis not present

## 2021-07-06 DIAGNOSIS — R278 Other lack of coordination: Secondary | ICD-10-CM | POA: Diagnosis not present

## 2021-07-07 NOTE — Progress Notes (Signed)
Reviewed and agree with documentation and assessment and plan. K. Veena Lamine [place] , MD   

## 2021-07-08 DIAGNOSIS — J690 Pneumonitis due to inhalation of food and vomit: Secondary | ICD-10-CM | POA: Diagnosis not present

## 2021-07-08 DIAGNOSIS — Z4789 Encounter for other orthopedic aftercare: Secondary | ICD-10-CM | POA: Diagnosis not present

## 2021-07-08 DIAGNOSIS — S82852D Displaced trimalleolar fracture of left lower leg, subsequent encounter for closed fracture with routine healing: Secondary | ICD-10-CM | POA: Diagnosis not present

## 2021-07-08 DIAGNOSIS — W19XXXD Unspecified fall, subsequent encounter: Secondary | ICD-10-CM | POA: Diagnosis not present

## 2021-07-08 DIAGNOSIS — R278 Other lack of coordination: Secondary | ICD-10-CM | POA: Diagnosis not present

## 2021-07-08 DIAGNOSIS — M25572 Pain in left ankle and joints of left foot: Secondary | ICD-10-CM | POA: Diagnosis not present

## 2021-07-09 DIAGNOSIS — M25572 Pain in left ankle and joints of left foot: Secondary | ICD-10-CM | POA: Diagnosis not present

## 2021-07-09 DIAGNOSIS — S82852D Displaced trimalleolar fracture of left lower leg, subsequent encounter for closed fracture with routine healing: Secondary | ICD-10-CM | POA: Diagnosis not present

## 2021-07-09 DIAGNOSIS — T8130XA Disruption of wound, unspecified, initial encounter: Secondary | ICD-10-CM | POA: Diagnosis not present

## 2021-07-12 DIAGNOSIS — S82852D Displaced trimalleolar fracture of left lower leg, subsequent encounter for closed fracture with routine healing: Secondary | ICD-10-CM | POA: Diagnosis not present

## 2021-07-12 DIAGNOSIS — F03918 Unspecified dementia, unspecified severity, with other behavioral disturbance: Secondary | ICD-10-CM | POA: Diagnosis not present

## 2021-07-12 DIAGNOSIS — Z4789 Encounter for other orthopedic aftercare: Secondary | ICD-10-CM | POA: Diagnosis not present

## 2021-07-12 DIAGNOSIS — F4323 Adjustment disorder with mixed anxiety and depressed mood: Secondary | ICD-10-CM | POA: Diagnosis not present

## 2021-07-12 DIAGNOSIS — J690 Pneumonitis due to inhalation of food and vomit: Secondary | ICD-10-CM | POA: Diagnosis not present

## 2021-07-12 DIAGNOSIS — F29 Unspecified psychosis not due to a substance or known physiological condition: Secondary | ICD-10-CM | POA: Diagnosis not present

## 2021-07-12 DIAGNOSIS — R278 Other lack of coordination: Secondary | ICD-10-CM | POA: Diagnosis not present

## 2021-07-12 DIAGNOSIS — M25572 Pain in left ankle and joints of left foot: Secondary | ICD-10-CM | POA: Diagnosis not present

## 2021-07-12 DIAGNOSIS — W19XXXD Unspecified fall, subsequent encounter: Secondary | ICD-10-CM | POA: Diagnosis not present

## 2021-07-13 DIAGNOSIS — R278 Other lack of coordination: Secondary | ICD-10-CM | POA: Diagnosis not present

## 2021-07-13 DIAGNOSIS — M25572 Pain in left ankle and joints of left foot: Secondary | ICD-10-CM | POA: Diagnosis not present

## 2021-07-13 DIAGNOSIS — W19XXXD Unspecified fall, subsequent encounter: Secondary | ICD-10-CM | POA: Diagnosis not present

## 2021-07-13 DIAGNOSIS — Z4789 Encounter for other orthopedic aftercare: Secondary | ICD-10-CM | POA: Diagnosis not present

## 2021-07-13 DIAGNOSIS — S82852D Displaced trimalleolar fracture of left lower leg, subsequent encounter for closed fracture with routine healing: Secondary | ICD-10-CM | POA: Diagnosis not present

## 2021-07-13 DIAGNOSIS — J690 Pneumonitis due to inhalation of food and vomit: Secondary | ICD-10-CM | POA: Diagnosis not present

## 2021-07-14 DIAGNOSIS — R279 Unspecified lack of coordination: Secondary | ICD-10-CM | POA: Diagnosis not present

## 2021-07-14 DIAGNOSIS — M6281 Muscle weakness (generalized): Secondary | ICD-10-CM | POA: Diagnosis not present

## 2021-07-14 DIAGNOSIS — F0390 Unspecified dementia without behavioral disturbance: Secondary | ICD-10-CM | POA: Diagnosis not present

## 2021-07-14 DIAGNOSIS — R278 Other lack of coordination: Secondary | ICD-10-CM | POA: Diagnosis not present

## 2021-07-14 DIAGNOSIS — S82852D Displaced trimalleolar fracture of left lower leg, subsequent encounter for closed fracture with routine healing: Secondary | ICD-10-CM | POA: Diagnosis not present

## 2021-07-14 DIAGNOSIS — W19XXXD Unspecified fall, subsequent encounter: Secondary | ICD-10-CM | POA: Diagnosis not present

## 2021-07-14 DIAGNOSIS — Z4789 Encounter for other orthopedic aftercare: Secondary | ICD-10-CM | POA: Diagnosis not present

## 2021-07-14 DIAGNOSIS — R2681 Unsteadiness on feet: Secondary | ICD-10-CM | POA: Diagnosis not present

## 2021-07-14 DIAGNOSIS — R41841 Cognitive communication deficit: Secondary | ICD-10-CM | POA: Diagnosis not present

## 2021-07-14 DIAGNOSIS — M25572 Pain in left ankle and joints of left foot: Secondary | ICD-10-CM | POA: Diagnosis not present

## 2021-07-14 DIAGNOSIS — J690 Pneumonitis due to inhalation of food and vomit: Secondary | ICD-10-CM | POA: Diagnosis not present

## 2021-07-16 DIAGNOSIS — S82852D Displaced trimalleolar fracture of left lower leg, subsequent encounter for closed fracture with routine healing: Secondary | ICD-10-CM | POA: Diagnosis not present

## 2021-07-16 DIAGNOSIS — W19XXXD Unspecified fall, subsequent encounter: Secondary | ICD-10-CM | POA: Diagnosis not present

## 2021-07-16 DIAGNOSIS — R2681 Unsteadiness on feet: Secondary | ICD-10-CM | POA: Diagnosis not present

## 2021-07-16 DIAGNOSIS — M25572 Pain in left ankle and joints of left foot: Secondary | ICD-10-CM | POA: Diagnosis not present

## 2021-07-16 DIAGNOSIS — R278 Other lack of coordination: Secondary | ICD-10-CM | POA: Diagnosis not present

## 2021-07-16 DIAGNOSIS — M8580 Other specified disorders of bone density and structure, unspecified site: Secondary | ICD-10-CM | POA: Diagnosis not present

## 2021-07-16 DIAGNOSIS — M6281 Muscle weakness (generalized): Secondary | ICD-10-CM | POA: Diagnosis not present

## 2021-07-16 DIAGNOSIS — C61 Malignant neoplasm of prostate: Secondary | ICD-10-CM | POA: Diagnosis not present

## 2021-07-19 DIAGNOSIS — M25572 Pain in left ankle and joints of left foot: Secondary | ICD-10-CM | POA: Diagnosis not present

## 2021-07-19 DIAGNOSIS — R278 Other lack of coordination: Secondary | ICD-10-CM | POA: Diagnosis not present

## 2021-07-19 DIAGNOSIS — W19XXXD Unspecified fall, subsequent encounter: Secondary | ICD-10-CM | POA: Diagnosis not present

## 2021-07-19 DIAGNOSIS — S82852D Displaced trimalleolar fracture of left lower leg, subsequent encounter for closed fracture with routine healing: Secondary | ICD-10-CM | POA: Diagnosis not present

## 2021-07-19 DIAGNOSIS — M6281 Muscle weakness (generalized): Secondary | ICD-10-CM | POA: Diagnosis not present

## 2021-07-19 DIAGNOSIS — R2681 Unsteadiness on feet: Secondary | ICD-10-CM | POA: Diagnosis not present

## 2021-07-20 DIAGNOSIS — M6281 Muscle weakness (generalized): Secondary | ICD-10-CM | POA: Diagnosis not present

## 2021-07-20 DIAGNOSIS — W19XXXD Unspecified fall, subsequent encounter: Secondary | ICD-10-CM | POA: Diagnosis not present

## 2021-07-20 DIAGNOSIS — R2681 Unsteadiness on feet: Secondary | ICD-10-CM | POA: Diagnosis not present

## 2021-07-20 DIAGNOSIS — R278 Other lack of coordination: Secondary | ICD-10-CM | POA: Diagnosis not present

## 2021-07-20 DIAGNOSIS — S82852D Displaced trimalleolar fracture of left lower leg, subsequent encounter for closed fracture with routine healing: Secondary | ICD-10-CM | POA: Diagnosis not present

## 2021-07-20 DIAGNOSIS — M25572 Pain in left ankle and joints of left foot: Secondary | ICD-10-CM | POA: Diagnosis not present

## 2021-07-21 ENCOUNTER — Other Ambulatory Visit (HOSPITAL_COMMUNITY): Payer: Self-pay | Admitting: Urology

## 2021-07-21 DIAGNOSIS — C61 Malignant neoplasm of prostate: Secondary | ICD-10-CM

## 2021-07-26 DIAGNOSIS — R2681 Unsteadiness on feet: Secondary | ICD-10-CM | POA: Diagnosis not present

## 2021-07-26 DIAGNOSIS — M6281 Muscle weakness (generalized): Secondary | ICD-10-CM | POA: Diagnosis not present

## 2021-07-26 DIAGNOSIS — W19XXXD Unspecified fall, subsequent encounter: Secondary | ICD-10-CM | POA: Diagnosis not present

## 2021-07-26 DIAGNOSIS — M25572 Pain in left ankle and joints of left foot: Secondary | ICD-10-CM | POA: Diagnosis not present

## 2021-07-26 DIAGNOSIS — S82852D Displaced trimalleolar fracture of left lower leg, subsequent encounter for closed fracture with routine healing: Secondary | ICD-10-CM | POA: Diagnosis not present

## 2021-07-26 DIAGNOSIS — R278 Other lack of coordination: Secondary | ICD-10-CM | POA: Diagnosis not present

## 2021-07-28 DIAGNOSIS — F29 Unspecified psychosis not due to a substance or known physiological condition: Secondary | ICD-10-CM | POA: Diagnosis not present

## 2021-07-28 DIAGNOSIS — F03918 Unspecified dementia, unspecified severity, with other behavioral disturbance: Secondary | ICD-10-CM | POA: Diagnosis not present

## 2021-08-02 ENCOUNTER — Ambulatory Visit: Payer: Medicare Other | Admitting: Podiatry

## 2021-08-02 DIAGNOSIS — S82852D Displaced trimalleolar fracture of left lower leg, subsequent encounter for closed fracture with routine healing: Secondary | ICD-10-CM | POA: Diagnosis not present

## 2021-08-02 DIAGNOSIS — W19XXXD Unspecified fall, subsequent encounter: Secondary | ICD-10-CM | POA: Diagnosis not present

## 2021-08-02 DIAGNOSIS — R2681 Unsteadiness on feet: Secondary | ICD-10-CM | POA: Diagnosis not present

## 2021-08-02 DIAGNOSIS — M25572 Pain in left ankle and joints of left foot: Secondary | ICD-10-CM | POA: Diagnosis not present

## 2021-08-02 DIAGNOSIS — M6281 Muscle weakness (generalized): Secondary | ICD-10-CM | POA: Diagnosis not present

## 2021-08-02 DIAGNOSIS — R278 Other lack of coordination: Secondary | ICD-10-CM | POA: Diagnosis not present

## 2021-08-03 DIAGNOSIS — M6281 Muscle weakness (generalized): Secondary | ICD-10-CM | POA: Diagnosis not present

## 2021-08-03 DIAGNOSIS — W19XXXD Unspecified fall, subsequent encounter: Secondary | ICD-10-CM | POA: Diagnosis not present

## 2021-08-03 DIAGNOSIS — S82852D Displaced trimalleolar fracture of left lower leg, subsequent encounter for closed fracture with routine healing: Secondary | ICD-10-CM | POA: Diagnosis not present

## 2021-08-03 DIAGNOSIS — R278 Other lack of coordination: Secondary | ICD-10-CM | POA: Diagnosis not present

## 2021-08-03 DIAGNOSIS — M25572 Pain in left ankle and joints of left foot: Secondary | ICD-10-CM | POA: Diagnosis not present

## 2021-08-03 DIAGNOSIS — R2681 Unsteadiness on feet: Secondary | ICD-10-CM | POA: Diagnosis not present

## 2021-08-04 ENCOUNTER — Other Ambulatory Visit (HOSPITAL_COMMUNITY): Payer: 59

## 2021-08-07 DIAGNOSIS — Z79899 Other long term (current) drug therapy: Secondary | ICD-10-CM | POA: Diagnosis not present

## 2021-08-09 DIAGNOSIS — F29 Unspecified psychosis not due to a substance or known physiological condition: Secondary | ICD-10-CM | POA: Diagnosis not present

## 2021-08-09 DIAGNOSIS — F03918 Unspecified dementia, unspecified severity, with other behavioral disturbance: Secondary | ICD-10-CM | POA: Diagnosis not present

## 2021-08-10 DIAGNOSIS — H353211 Exudative age-related macular degeneration, right eye, with active choroidal neovascularization: Secondary | ICD-10-CM | POA: Diagnosis not present

## 2021-08-12 DIAGNOSIS — U071 COVID-19: Secondary | ICD-10-CM | POA: Diagnosis not present

## 2021-08-12 DIAGNOSIS — S82852D Displaced trimalleolar fracture of left lower leg, subsequent encounter for closed fracture with routine healing: Secondary | ICD-10-CM | POA: Diagnosis not present

## 2021-08-13 DIAGNOSIS — H3530 Unspecified macular degeneration: Secondary | ICD-10-CM | POA: Diagnosis not present

## 2021-08-13 DIAGNOSIS — S82852D Displaced trimalleolar fracture of left lower leg, subsequent encounter for closed fracture with routine healing: Secondary | ICD-10-CM | POA: Diagnosis not present

## 2021-08-13 DIAGNOSIS — H9190 Unspecified hearing loss, unspecified ear: Secondary | ICD-10-CM | POA: Diagnosis not present

## 2021-08-13 DIAGNOSIS — R41841 Cognitive communication deficit: Secondary | ICD-10-CM | POA: Diagnosis not present

## 2021-08-13 DIAGNOSIS — E785 Hyperlipidemia, unspecified: Secondary | ICD-10-CM | POA: Diagnosis not present

## 2021-08-13 DIAGNOSIS — W19XXXD Unspecified fall, subsequent encounter: Secondary | ICD-10-CM | POA: Diagnosis not present

## 2021-08-13 DIAGNOSIS — R601 Generalized edema: Secondary | ICD-10-CM | POA: Diagnosis not present

## 2021-08-13 DIAGNOSIS — I10 Essential (primary) hypertension: Secondary | ICD-10-CM | POA: Diagnosis not present

## 2021-08-13 DIAGNOSIS — M25572 Pain in left ankle and joints of left foot: Secondary | ICD-10-CM | POA: Diagnosis not present

## 2021-08-13 DIAGNOSIS — E43 Unspecified severe protein-calorie malnutrition: Secondary | ICD-10-CM | POA: Diagnosis not present

## 2021-08-13 DIAGNOSIS — Z9181 History of falling: Secondary | ICD-10-CM | POA: Diagnosis not present

## 2021-08-13 DIAGNOSIS — E119 Type 2 diabetes mellitus without complications: Secondary | ICD-10-CM | POA: Diagnosis not present

## 2021-08-13 DIAGNOSIS — D696 Thrombocytopenia, unspecified: Secondary | ICD-10-CM | POA: Diagnosis not present

## 2021-08-13 DIAGNOSIS — R2681 Unsteadiness on feet: Secondary | ICD-10-CM | POA: Diagnosis not present

## 2021-08-13 DIAGNOSIS — L24A2 Irritant contact dermatitis due to fecal, urinary or dual incontinence: Secondary | ICD-10-CM | POA: Diagnosis not present

## 2021-08-13 DIAGNOSIS — M6281 Muscle weakness (generalized): Secondary | ICD-10-CM | POA: Diagnosis not present

## 2021-08-13 DIAGNOSIS — J189 Pneumonia, unspecified organism: Secondary | ICD-10-CM | POA: Diagnosis not present

## 2021-08-13 DIAGNOSIS — R1314 Dysphagia, pharyngoesophageal phase: Secondary | ICD-10-CM | POA: Diagnosis not present

## 2021-08-13 DIAGNOSIS — J690 Pneumonitis due to inhalation of food and vomit: Secondary | ICD-10-CM | POA: Diagnosis not present

## 2021-08-13 DIAGNOSIS — R0902 Hypoxemia: Secondary | ICD-10-CM | POA: Diagnosis not present

## 2021-08-13 DIAGNOSIS — R278 Other lack of coordination: Secondary | ICD-10-CM | POA: Diagnosis not present

## 2021-08-13 DIAGNOSIS — R279 Unspecified lack of coordination: Secondary | ICD-10-CM | POA: Diagnosis not present

## 2021-08-18 DIAGNOSIS — L24A2 Irritant contact dermatitis due to fecal, urinary or dual incontinence: Secondary | ICD-10-CM | POA: Diagnosis not present

## 2021-08-21 DIAGNOSIS — R0902 Hypoxemia: Secondary | ICD-10-CM | POA: Diagnosis not present

## 2021-08-24 DIAGNOSIS — R601 Generalized edema: Secondary | ICD-10-CM | POA: Diagnosis not present

## 2021-08-24 DIAGNOSIS — J189 Pneumonia, unspecified organism: Secondary | ICD-10-CM | POA: Diagnosis not present

## 2021-08-25 DIAGNOSIS — L24A2 Irritant contact dermatitis due to fecal, urinary or dual incontinence: Secondary | ICD-10-CM | POA: Diagnosis not present

## 2021-08-26 ENCOUNTER — Other Ambulatory Visit: Payer: Self-pay | Admitting: *Deleted

## 2021-08-26 NOTE — Patient Outreach (Signed)
[facility] Post- Acute Care Coordinator follow up. Mr.Krol resides in [facility]. Screening for potential [facility] Care Management services.  ? ?Update received from [place] indicating Mr. Brassell is now long term care resident at [facility].  ? ?Will sign off. No identifiable [facility] Care Management needs.  ? ?Marthenia Rolling, MSN, RN,BSN ?[place] Coordinator ?[phone] [facility]) ?[phone]  (Toll free office)  ?

## 2021-09-01 DIAGNOSIS — F29 Unspecified psychosis not due to a substance or known physiological condition: Secondary | ICD-10-CM | POA: Diagnosis not present

## 2021-09-01 DIAGNOSIS — L24A2 Irritant contact dermatitis due to fecal, urinary or dual incontinence: Secondary | ICD-10-CM | POA: Diagnosis not present

## 2021-09-01 DIAGNOSIS — F03918 Unspecified dementia, unspecified severity, with other behavioral disturbance: Secondary | ICD-10-CM | POA: Diagnosis not present

## 2021-09-08 DIAGNOSIS — L24A2 Irritant contact dermatitis due to fecal, urinary or dual incontinence: Secondary | ICD-10-CM | POA: Diagnosis not present

## 2021-09-22 DIAGNOSIS — L24A2 Irritant contact dermatitis due to fecal, urinary or dual incontinence: Secondary | ICD-10-CM | POA: Diagnosis not present

## 2021-09-29 DIAGNOSIS — F02C2 Dementia in other diseases classified elsewhere, severe, with psychotic disturbance: Secondary | ICD-10-CM | POA: Diagnosis not present

## 2021-10-29 DIAGNOSIS — E119 Type 2 diabetes mellitus without complications: Secondary | ICD-10-CM | POA: Diagnosis not present

## 2021-10-29 DIAGNOSIS — Z79899 Other long term (current) drug therapy: Secondary | ICD-10-CM | POA: Diagnosis not present

## 2021-11-05 ENCOUNTER — Ambulatory Visit: Payer: Medicare Other | Admitting: Podiatry

## 2021-11-09 DIAGNOSIS — R4182 Altered mental status, unspecified: Secondary | ICD-10-CM | POA: Diagnosis not present

## 2021-11-09 DIAGNOSIS — R2681 Unsteadiness on feet: Secondary | ICD-10-CM | POA: Diagnosis not present

## 2021-11-09 DIAGNOSIS — R279 Unspecified lack of coordination: Secondary | ICD-10-CM | POA: Diagnosis not present

## 2021-11-09 DIAGNOSIS — M6281 Muscle weakness (generalized): Secondary | ICD-10-CM | POA: Diagnosis not present

## 2021-11-09 DIAGNOSIS — S82852D Displaced trimalleolar fracture of left lower leg, subsequent encounter for closed fracture with routine healing: Secondary | ICD-10-CM | POA: Diagnosis not present

## 2021-11-09 DIAGNOSIS — R41841 Cognitive communication deficit: Secondary | ICD-10-CM | POA: Diagnosis not present

## 2021-11-09 DIAGNOSIS — Z9181 History of falling: Secondary | ICD-10-CM | POA: Diagnosis not present

## 2021-11-10 DIAGNOSIS — M6281 Muscle weakness (generalized): Secondary | ICD-10-CM | POA: Diagnosis not present

## 2021-11-10 DIAGNOSIS — R279 Unspecified lack of coordination: Secondary | ICD-10-CM | POA: Diagnosis not present

## 2021-11-10 DIAGNOSIS — R2681 Unsteadiness on feet: Secondary | ICD-10-CM | POA: Diagnosis not present

## 2021-11-10 DIAGNOSIS — R4182 Altered mental status, unspecified: Secondary | ICD-10-CM | POA: Diagnosis not present

## 2021-11-10 DIAGNOSIS — Z9181 History of falling: Secondary | ICD-10-CM | POA: Diagnosis not present

## 2021-11-10 DIAGNOSIS — S82852D Displaced trimalleolar fracture of left lower leg, subsequent encounter for closed fracture with routine healing: Secondary | ICD-10-CM | POA: Diagnosis not present

## 2021-11-11 DIAGNOSIS — R279 Unspecified lack of coordination: Secondary | ICD-10-CM | POA: Diagnosis not present

## 2021-11-11 DIAGNOSIS — S82852D Displaced trimalleolar fracture of left lower leg, subsequent encounter for closed fracture with routine healing: Secondary | ICD-10-CM | POA: Diagnosis not present

## 2021-11-11 DIAGNOSIS — R4182 Altered mental status, unspecified: Secondary | ICD-10-CM | POA: Diagnosis not present

## 2021-11-11 DIAGNOSIS — M6281 Muscle weakness (generalized): Secondary | ICD-10-CM | POA: Diagnosis not present

## 2021-11-11 DIAGNOSIS — R2681 Unsteadiness on feet: Secondary | ICD-10-CM | POA: Diagnosis not present

## 2021-11-11 DIAGNOSIS — Z9181 History of falling: Secondary | ICD-10-CM | POA: Diagnosis not present

## 2021-11-15 DIAGNOSIS — Z9181 History of falling: Secondary | ICD-10-CM | POA: Diagnosis not present

## 2021-11-15 DIAGNOSIS — R2681 Unsteadiness on feet: Secondary | ICD-10-CM | POA: Diagnosis not present

## 2021-11-15 DIAGNOSIS — M6281 Muscle weakness (generalized): Secondary | ICD-10-CM | POA: Diagnosis not present

## 2021-11-15 DIAGNOSIS — R279 Unspecified lack of coordination: Secondary | ICD-10-CM | POA: Diagnosis not present

## 2021-11-15 DIAGNOSIS — R4182 Altered mental status, unspecified: Secondary | ICD-10-CM | POA: Diagnosis not present

## 2021-11-15 DIAGNOSIS — S82852D Displaced trimalleolar fracture of left lower leg, subsequent encounter for closed fracture with routine healing: Secondary | ICD-10-CM | POA: Diagnosis not present

## 2021-11-16 DIAGNOSIS — R279 Unspecified lack of coordination: Secondary | ICD-10-CM | POA: Diagnosis not present

## 2021-11-16 DIAGNOSIS — R4182 Altered mental status, unspecified: Secondary | ICD-10-CM | POA: Diagnosis not present

## 2021-11-16 DIAGNOSIS — Z9181 History of falling: Secondary | ICD-10-CM | POA: Diagnosis not present

## 2021-11-16 DIAGNOSIS — M6281 Muscle weakness (generalized): Secondary | ICD-10-CM | POA: Diagnosis not present

## 2021-11-16 DIAGNOSIS — S82852D Displaced trimalleolar fracture of left lower leg, subsequent encounter for closed fracture with routine healing: Secondary | ICD-10-CM | POA: Diagnosis not present

## 2021-11-16 DIAGNOSIS — R2681 Unsteadiness on feet: Secondary | ICD-10-CM | POA: Diagnosis not present

## 2021-11-18 DIAGNOSIS — R2681 Unsteadiness on feet: Secondary | ICD-10-CM | POA: Diagnosis not present

## 2021-11-18 DIAGNOSIS — R4182 Altered mental status, unspecified: Secondary | ICD-10-CM | POA: Diagnosis not present

## 2021-11-18 DIAGNOSIS — Z9181 History of falling: Secondary | ICD-10-CM | POA: Diagnosis not present

## 2021-11-18 DIAGNOSIS — M6281 Muscle weakness (generalized): Secondary | ICD-10-CM | POA: Diagnosis not present

## 2021-11-18 DIAGNOSIS — S82852D Displaced trimalleolar fracture of left lower leg, subsequent encounter for closed fracture with routine healing: Secondary | ICD-10-CM | POA: Diagnosis not present

## 2021-11-18 DIAGNOSIS — R279 Unspecified lack of coordination: Secondary | ICD-10-CM | POA: Diagnosis not present

## 2021-11-22 DIAGNOSIS — R4182 Altered mental status, unspecified: Secondary | ICD-10-CM | POA: Diagnosis not present

## 2021-11-22 DIAGNOSIS — R279 Unspecified lack of coordination: Secondary | ICD-10-CM | POA: Diagnosis not present

## 2021-11-22 DIAGNOSIS — M6281 Muscle weakness (generalized): Secondary | ICD-10-CM | POA: Diagnosis not present

## 2021-11-22 DIAGNOSIS — Z9181 History of falling: Secondary | ICD-10-CM | POA: Diagnosis not present

## 2021-11-22 DIAGNOSIS — S82852D Displaced trimalleolar fracture of left lower leg, subsequent encounter for closed fracture with routine healing: Secondary | ICD-10-CM | POA: Diagnosis not present

## 2021-11-22 DIAGNOSIS — R2681 Unsteadiness on feet: Secondary | ICD-10-CM | POA: Diagnosis not present

## 2021-11-23 DIAGNOSIS — R4182 Altered mental status, unspecified: Secondary | ICD-10-CM | POA: Diagnosis not present

## 2021-11-23 DIAGNOSIS — R279 Unspecified lack of coordination: Secondary | ICD-10-CM | POA: Diagnosis not present

## 2021-11-23 DIAGNOSIS — M6281 Muscle weakness (generalized): Secondary | ICD-10-CM | POA: Diagnosis not present

## 2021-11-23 DIAGNOSIS — S82852D Displaced trimalleolar fracture of left lower leg, subsequent encounter for closed fracture with routine healing: Secondary | ICD-10-CM | POA: Diagnosis not present

## 2021-11-23 DIAGNOSIS — Z9181 History of falling: Secondary | ICD-10-CM | POA: Diagnosis not present

## 2021-11-23 DIAGNOSIS — R2681 Unsteadiness on feet: Secondary | ICD-10-CM | POA: Diagnosis not present

## 2021-11-24 DIAGNOSIS — L24A2 Irritant contact dermatitis due to fecal, urinary or dual incontinence: Secondary | ICD-10-CM | POA: Diagnosis not present

## 2021-11-26 DIAGNOSIS — S82852D Displaced trimalleolar fracture of left lower leg, subsequent encounter for closed fracture with routine healing: Secondary | ICD-10-CM | POA: Diagnosis not present

## 2021-11-26 DIAGNOSIS — R2681 Unsteadiness on feet: Secondary | ICD-10-CM | POA: Diagnosis not present

## 2021-11-26 DIAGNOSIS — M6281 Muscle weakness (generalized): Secondary | ICD-10-CM | POA: Diagnosis not present

## 2021-11-26 DIAGNOSIS — R279 Unspecified lack of coordination: Secondary | ICD-10-CM | POA: Diagnosis not present

## 2021-11-26 DIAGNOSIS — Z9181 History of falling: Secondary | ICD-10-CM | POA: Diagnosis not present

## 2021-11-26 DIAGNOSIS — R4182 Altered mental status, unspecified: Secondary | ICD-10-CM | POA: Diagnosis not present

## 2021-11-29 DIAGNOSIS — M6281 Muscle weakness (generalized): Secondary | ICD-10-CM | POA: Diagnosis not present

## 2021-11-29 DIAGNOSIS — R279 Unspecified lack of coordination: Secondary | ICD-10-CM | POA: Diagnosis not present

## 2021-11-29 DIAGNOSIS — S82852D Displaced trimalleolar fracture of left lower leg, subsequent encounter for closed fracture with routine healing: Secondary | ICD-10-CM | POA: Diagnosis not present

## 2021-11-29 DIAGNOSIS — R2681 Unsteadiness on feet: Secondary | ICD-10-CM | POA: Diagnosis not present

## 2021-11-29 DIAGNOSIS — Z9181 History of falling: Secondary | ICD-10-CM | POA: Diagnosis not present

## 2021-11-29 DIAGNOSIS — R4182 Altered mental status, unspecified: Secondary | ICD-10-CM | POA: Diagnosis not present

## 2021-11-30 DIAGNOSIS — Z79899 Other long term (current) drug therapy: Secondary | ICD-10-CM | POA: Diagnosis not present

## 2021-12-01 DIAGNOSIS — L24A2 Irritant contact dermatitis due to fecal, urinary or dual incontinence: Secondary | ICD-10-CM | POA: Diagnosis not present

## 2021-12-06 DIAGNOSIS — F02C2 Dementia in other diseases classified elsewhere, severe, with psychotic disturbance: Secondary | ICD-10-CM | POA: Diagnosis not present

## 2021-12-06 DIAGNOSIS — F5101 Primary insomnia: Secondary | ICD-10-CM | POA: Diagnosis not present

## 2021-12-08 DIAGNOSIS — L24A2 Irritant contact dermatitis due to fecal, urinary or dual incontinence: Secondary | ICD-10-CM | POA: Diagnosis not present

## 2021-12-15 DIAGNOSIS — L24A2 Irritant contact dermatitis due to fecal, urinary or dual incontinence: Secondary | ICD-10-CM | POA: Diagnosis not present

## 2021-12-21 DIAGNOSIS — Z79899 Other long term (current) drug therapy: Secondary | ICD-10-CM | POA: Diagnosis not present

## 2021-12-21 DIAGNOSIS — R509 Fever, unspecified: Secondary | ICD-10-CM | POA: Diagnosis not present

## 2022-01-14 DEATH — deceased

## 2022-04-19 IMAGING — DX DG TOE GREAT 2+V*L*
3 series · 3 of 3 positions shown · non-contrast
Comparison: None.

CLINICAL DATA: Diabetes mellitus with blistering and bleeding first
digit

EXAM:
LEFT FIRST TOE: 3 V

[toe ap]
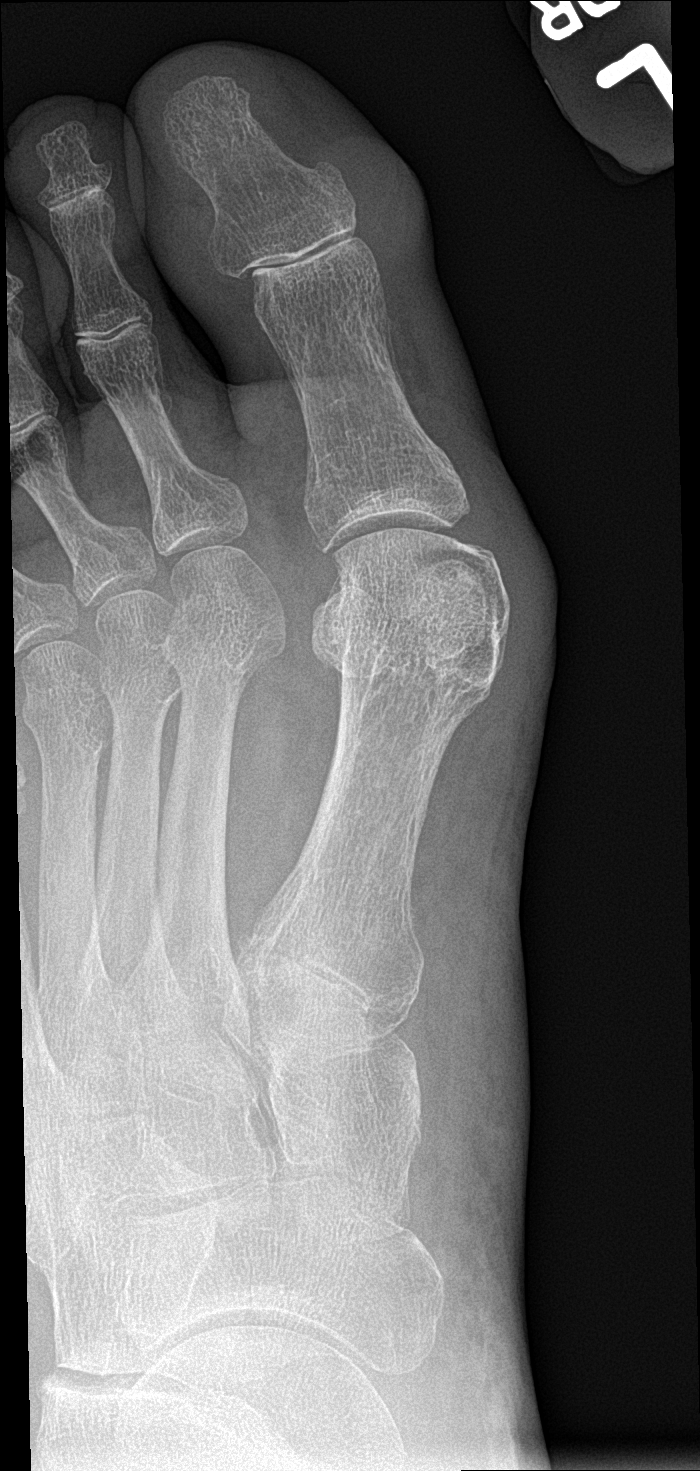

[toe obl]
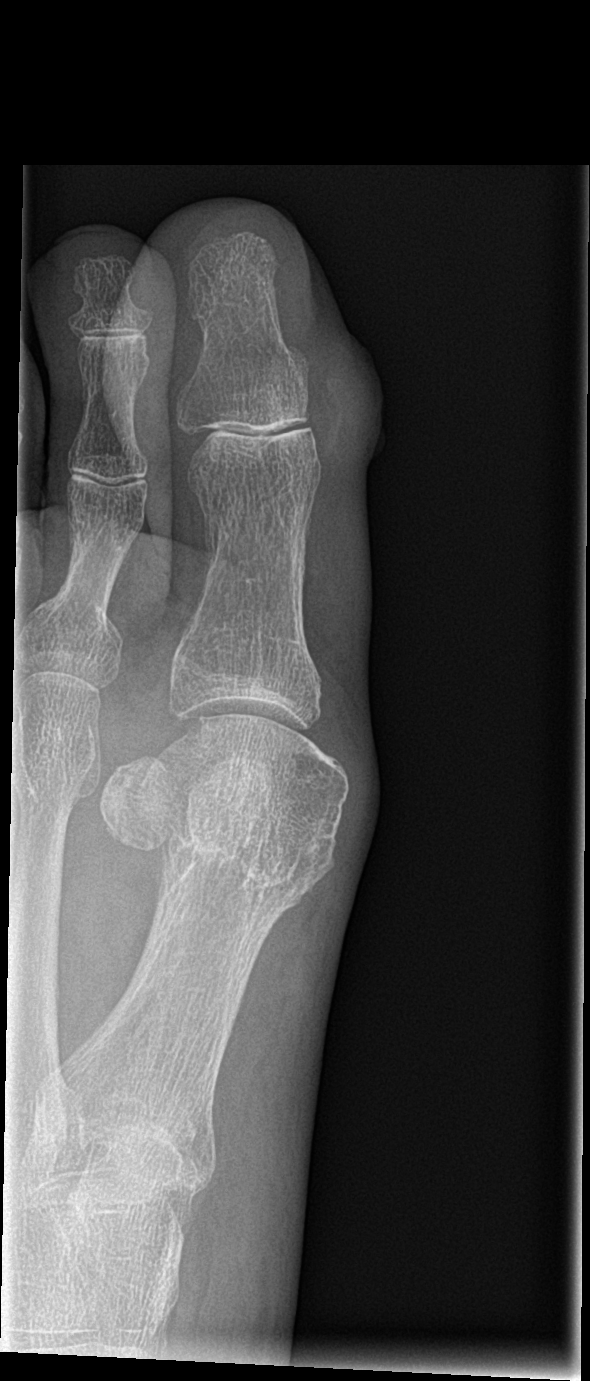

[toe lat]
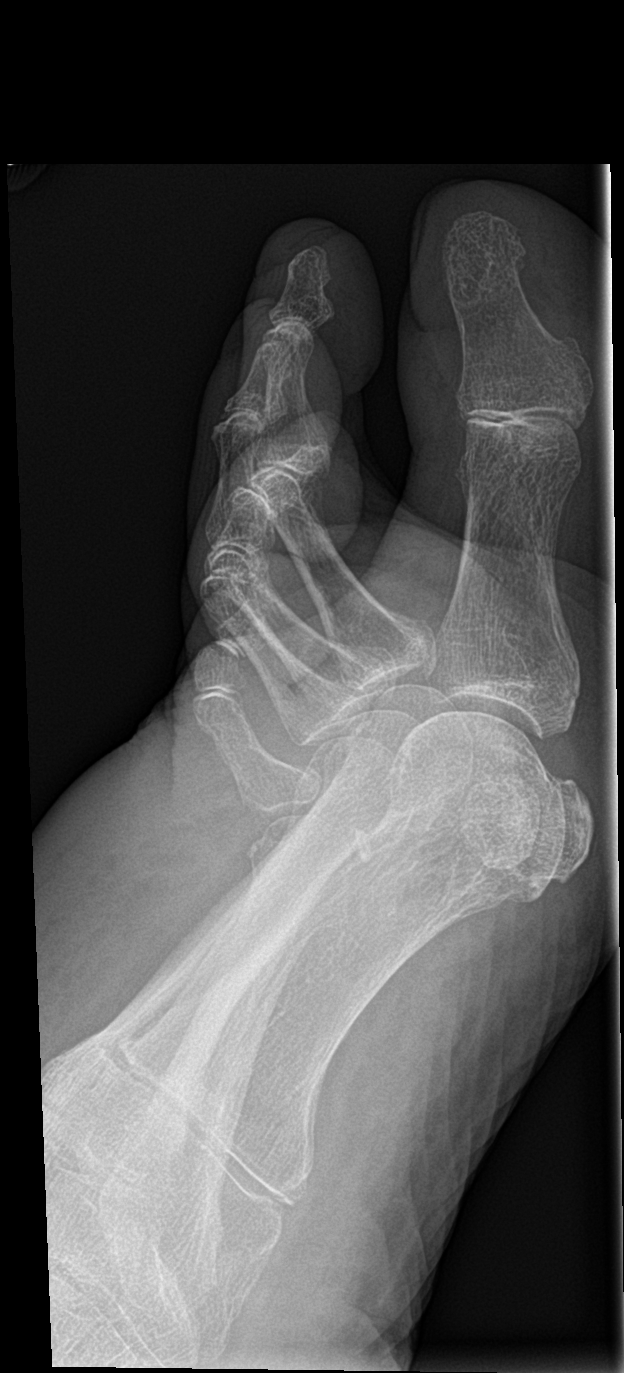

[3 of 3 positions shown; findings below may reference images not displayed]

FINDINGS: Frontal, oblique, and lateral views obtained. There is soft tissue
fullness with suspected mass medial to the first MTP joint. No soft
tissue air or radiopaque foreign body in this masslike area. No
fracture or dislocation. No bony destruction or erosion. Joint
spaces appear normal.
IMPRESSION: Apparent soft tissue mass medial to the first MTP joint measuring
1.6 x 1.0 cm. No associated soft tissue air. No fracture or
dislocation. No bony destruction or erosion. Joint spaces
unremarkable.
